# Patient Record
Sex: Female | Born: 1960 | Race: Black or African American | Hispanic: No | Marital: Single | Smoking: Never smoker
Health system: Southern US, Community
[De-identification: ages and names within clinical notes are randomized; demographics above are authoritative.]

## PROBLEM LIST (undated history)

## (undated) DIAGNOSIS — R011 Cardiac murmur, unspecified: Secondary | ICD-10-CM

## (undated) DIAGNOSIS — C801 Malignant (primary) neoplasm, unspecified: Secondary | ICD-10-CM

## (undated) DIAGNOSIS — F1910 Other psychoactive substance abuse, uncomplicated: Secondary | ICD-10-CM

## (undated) DIAGNOSIS — I38 Endocarditis, valve unspecified: Secondary | ICD-10-CM

## (undated) DIAGNOSIS — R001 Bradycardia, unspecified: Secondary | ICD-10-CM

## (undated) DIAGNOSIS — K5900 Constipation, unspecified: Secondary | ICD-10-CM

## (undated) DIAGNOSIS — G5600 Carpal tunnel syndrome, unspecified upper limb: Secondary | ICD-10-CM

## (undated) HISTORY — PX: COLON SURGERY: SHX602

## (undated) HISTORY — PX: COLONOSCOPY: SHX174

---

## 2004-03-12 HISTORY — PX: HERNIA REPAIR: SHX51

## 2004-04-15 ENCOUNTER — Emergency Department: Payer: Self-pay | Admitting: Emergency Medicine

## 2005-01-19 ENCOUNTER — Other Ambulatory Visit: Payer: Self-pay

## 2005-01-19 ENCOUNTER — Emergency Department: Payer: Self-pay | Admitting: Emergency Medicine

## 2005-06-12 ENCOUNTER — Ambulatory Visit: Payer: Self-pay | Admitting: Family Medicine

## 2005-09-20 ENCOUNTER — Emergency Department: Payer: Self-pay | Admitting: Emergency Medicine

## 2006-07-20 ENCOUNTER — Emergency Department: Payer: Self-pay | Admitting: Internal Medicine

## 2007-09-05 ENCOUNTER — Emergency Department: Payer: Self-pay | Admitting: Emergency Medicine

## 2008-10-16 ENCOUNTER — Emergency Department: Payer: Self-pay | Admitting: Unknown Physician Specialty

## 2009-03-24 ENCOUNTER — Ambulatory Visit: Payer: Self-pay | Admitting: Family Medicine

## 2009-12-11 ENCOUNTER — Emergency Department: Payer: Self-pay | Admitting: Emergency Medicine

## 2010-05-17 ENCOUNTER — Ambulatory Visit: Payer: Self-pay | Admitting: Family Medicine

## 2010-12-29 DIAGNOSIS — G5600 Carpal tunnel syndrome, unspecified upper limb: Secondary | ICD-10-CM | POA: Insufficient documentation

## 2011-02-18 ENCOUNTER — Emergency Department: Payer: Self-pay | Admitting: Emergency Medicine

## 2011-04-23 ENCOUNTER — Emergency Department: Payer: Self-pay | Admitting: Emergency Medicine

## 2011-04-23 LAB — URINALYSIS, COMPLETE
Bilirubin,UR: NEGATIVE
Glucose,UR: NEGATIVE mg/dL (ref 0–75)
Nitrite: NEGATIVE
Ph: 6 (ref 4.5–8.0)
WBC UR: 1 /HPF (ref 0–5)

## 2011-09-25 ENCOUNTER — Ambulatory Visit: Payer: Self-pay | Admitting: Family Medicine

## 2012-06-17 DIAGNOSIS — K5900 Constipation, unspecified: Secondary | ICD-10-CM | POA: Insufficient documentation

## 2012-10-08 ENCOUNTER — Ambulatory Visit: Payer: Self-pay | Admitting: Family Medicine

## 2012-10-27 ENCOUNTER — Encounter: Payer: Self-pay | Admitting: Family Medicine

## 2012-11-10 ENCOUNTER — Encounter: Payer: Self-pay | Admitting: Family Medicine

## 2014-02-17 ENCOUNTER — Emergency Department: Payer: Self-pay | Admitting: Emergency Medicine

## 2014-02-17 LAB — COMPREHENSIVE METABOLIC PANEL
ALK PHOS: 67 U/L
Albumin: 3.7 g/dL (ref 3.4–5.0)
Anion Gap: 7 (ref 7–16)
BILIRUBIN TOTAL: 0.4 mg/dL (ref 0.2–1.0)
BUN: 12 mg/dL (ref 7–18)
CHLORIDE: 106 mmol/L (ref 98–107)
CREATININE: 0.96 mg/dL (ref 0.60–1.30)
Calcium, Total: 9.2 mg/dL (ref 8.5–10.1)
Co2: 27 mmol/L (ref 21–32)
EGFR (Non-African Amer.): >60
Glucose: 88 mg/dL (ref 65–99)
Osmolality: 279 (ref 275–301)
Potassium: 3.7 mmol/L (ref 3.5–5.1)
SGOT(AST): 15 U/L (ref 15–37)
SGPT (ALT): 22 U/L
Sodium: 140 mmol/L (ref 136–145)
Total Protein: 7.6 g/dL (ref 6.4–8.2)

## 2014-02-17 LAB — CBC
HCT: 38.3 % (ref 35.0–47.0)
HGB: 12.3 g/dL (ref 12.0–16.0)
MCH: 28.5 pg (ref 26.0–34.0)
MCHC: 32.1 g/dL (ref 32.0–36.0)
MCV: 89 fL (ref 80–100)
Platelet: 267 10*3/uL (ref 150–440)
RBC: 4.32 10*6/uL (ref 3.80–5.20)
RDW: 14.7 % — AB (ref 11.5–14.5)
WBC: 4.8 10*3/uL (ref 3.6–11.0)

## 2014-02-17 LAB — URINALYSIS, COMPLETE
Bilirubin,UR: NEGATIVE
Blood: NEGATIVE
GLUCOSE, UR: NEGATIVE mg/dL (ref 0–75)
Ketone: NEGATIVE
Leukocyte Esterase: NEGATIVE
Nitrite: POSITIVE
Ph: 6 (ref 4.5–8.0)
Protein: NEGATIVE
RBC,UR: 2 /HPF (ref 0–5)
Specific Gravity: 1.021 (ref 1.003–1.030)
Squamous Epithelial: 1
WBC UR: 7 /HPF (ref 0–5)

## 2014-02-19 LAB — URINE CULTURE

## 2014-11-19 ENCOUNTER — Other Ambulatory Visit: Payer: Self-pay | Admitting: Family Medicine

## 2014-12-22 ENCOUNTER — Other Ambulatory Visit: Payer: Self-pay | Admitting: Family Medicine

## 2015-01-25 ENCOUNTER — Other Ambulatory Visit: Payer: Self-pay | Admitting: Family Medicine

## 2015-01-25 DIAGNOSIS — Z1239 Encounter for other screening for malignant neoplasm of breast: Secondary | ICD-10-CM

## 2015-01-27 ENCOUNTER — Ambulatory Visit
Admission: RE | Admit: 2015-01-27 | Discharge: 2015-01-27 | Disposition: A | Discharge status: Home / Self Care | Payer: Medicaid Other | Source: Ambulatory Visit | Attending: Family Medicine | Admitting: Family Medicine

## 2015-01-27 DIAGNOSIS — Z1231 Encounter for screening mammogram for malignant neoplasm of breast: Secondary | ICD-10-CM | POA: Diagnosis not present

## 2015-01-27 DIAGNOSIS — Z1239 Encounter for other screening for malignant neoplasm of breast: Secondary | ICD-10-CM

## 2015-04-01 ENCOUNTER — Encounter: Admission: RE | Payer: Self-pay | Source: Ambulatory Visit

## 2015-04-01 ENCOUNTER — Ambulatory Visit: Admission: RE | Admit: 2015-04-01 | Payer: Medicaid Other | Source: Ambulatory Visit | Admitting: Gastroenterology

## 2015-04-01 SURGERY — COLONOSCOPY WITH PROPOFOL
Anesthesia: General

## 2015-04-22 ENCOUNTER — Encounter: Payer: Self-pay | Admitting: *Deleted

## 2015-04-25 ENCOUNTER — Ambulatory Visit: Admission: RE | Admit: 2015-04-25 | Payer: Medicaid Other | Source: Ambulatory Visit | Admitting: Gastroenterology

## 2015-04-25 HISTORY — DX: Other psychoactive substance abuse, uncomplicated: F19.10

## 2015-04-25 HISTORY — DX: Endocarditis, valve unspecified: I38

## 2015-04-25 HISTORY — DX: Bradycardia, unspecified: R00.1

## 2015-04-25 HISTORY — DX: Cardiac murmur, unspecified: R01.1

## 2015-04-25 SURGERY — COLONOSCOPY WITH PROPOFOL
Anesthesia: General

## 2015-06-06 ENCOUNTER — Ambulatory Visit
Admission: EM | Admit: 2015-06-06 | Discharge: 2015-06-06 | Disposition: A | Discharge status: Home / Self Care | Payer: Medicaid Other | Attending: Emergency Medicine | Admitting: Emergency Medicine

## 2015-06-06 DIAGNOSIS — M5441 Lumbago with sciatica, right side: Secondary | ICD-10-CM | POA: Diagnosis not present

## 2015-06-06 MED ORDER — TRAMADOL HCL 50 MG PO TABS: ORAL_TABLET | ORAL | Status: DC

## 2015-06-06 MED ORDER — DICLOFENAC SODIUM 75 MG PO TBEC: 75.0000 mg | DELAYED_RELEASE_TABLET | Freq: Two times a day (BID) | ORAL | Status: DC

## 2015-06-06 MED ORDER — KETOROLAC TROMETHAMINE 60 MG/2ML IM SOLN
60.0000 mg | Freq: Once | INTRAMUSCULAR | Status: AC
  Administered 2015-06-06: 60 mg via INTRAMUSCULAR

## 2015-06-06 MED ORDER — METAXALONE 800 MG PO TABS: 800.0000 mg | ORAL_TABLET | Freq: Three times a day (TID) | ORAL | Status: DC

## 2015-06-06 MED ORDER — PREDNISONE 20 MG PO TABS: ORAL_TABLET | ORAL | Status: DC

## 2015-06-06 NOTE — ED Notes (Signed)
Patient states that she has a "paralyzing" back pain that has been intermittant for 1 year. Patient states that it worsened recently.

## 2015-06-06 NOTE — Discharge Instructions (Signed)
Follow-up with her primary care physician in several days for referral to physical therapy. Take the diclofenac twice a day, prednisone, the steroid and the Skelaxin. Tramadol for severe pain only. Go to the ER for the signs and symptoms we discussed.

## 2015-06-06 NOTE — ED Provider Notes (Signed)
HPI  SUBJECTIVE:  Courtney Ball is a 55 y.o. female who presents with stabbing right lower back pain with radiation down the back of her leg for the past or 4 days. 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 Symptoms are worse from going from sitting to standing and bending forward. No alleviating factors. She has been taking Tylenol 1000 mg every 4 hours yesterday. Has also tried Excedrin and BenGay. denies N/V, fevers, flank pain, abdominal pain, urinary urgency, frequency, dysuria, cloudy or odorous urine, hematuria.   No saddle anesthesia, distal weakness/numbness, bilateral radicular leg pain/weakness, fevers/night sweats, recent h/o trauma, neurological deficits,  bladder/ bowel incontinence, urinary retention, h/o CA / multiple myleoma, unexplained weight loss, pain worse at night,  h/o prolonged steroid use, h/o osteopenia, h/o IVDU, h/o HIV, recent bacteremia, known AAA.  States feels similar to previous episodes of back pain that she has had on and off for the past year. no h/o pyelonephritis, nephrolithiasis.  PMD: Dr. Gillis Ends at [facility] primary care. LMP: Postmenopausal. Of note patient has a history of cocaine abuse, states her last use was 3 days ago.    Past Medical History  Diagnosis Date  . Bradycardia   . Drug abuse   . Heart murmur   . Valvular heart disease     Past Surgical History  Procedure Laterality Date  . Hernia repair  2006    ventral    History reviewed. No pertinent family history.  Social History  Substance Use Topics  . Smoking status: Never Smoker   . Smokeless tobacco: Never Used  . Alcohol Use: No    No current facility-administered medications for this encounter.  Current outpatient prescriptions:  .  diclofenac (VOLTAREN) 75 MG EC tablet, Take 1 tablet (75 mg total) by mouth 2 (two) times daily. Take with food, Disp: 30 tablet, Rfl: 0 .  metaxalone (SKELAXIN) 800  MG tablet, Take 1 tablet (800 mg total) by mouth 3 (three) times daily., Disp: 21 tablet, Rfl: 0 .  predniSONE (DELTASONE) 20 MG tablet, Take 3 tabs po on first day, 2 tabs second day, 2 tabs third day, 1 tab fourth day, 1 tab 5th day. Take with food., Disp: 9 tablet, Rfl: 0 .  traMADol (ULTRAM) 50 MG tablet, 1-2 tabs po q 6 hr prn pain Maximum dose= 8 tablets per day, Disp: 20 tablet, Rfl: 0  Allergies  Allergen Reactions  . Sulfa Antibiotics Hives     ROS  As noted in HPI.   Physical Exam  BP 148/71 mmHg  Pulse 73  Temp(Src) 97.8 F (36.6 C) (Tympanic)  Resp 16  Ht  (1.626 m)  Wt 132 lb (59.875 kg)  BMI 22.65 kg/m2  SpO2 100%  Constitutional: Well developed, well nourished, appears uncomfortable Eyes:  EOMI, conjunctiva normal bilaterally HENT: Normocephalic, atraumatic,mucus membranes moist Respiratory: Normal inspiratory effort Cardiovascular: Normal rate GI: nondistended. No suprapubic tenderness. No pulsatile abdominal mass. skin: No rash, skin intact Musculoskeletal: no CVAT. +  paralumbar tenderness, - muscle spasm. No bony tenderness. Bilateral lower extremities nontender , baseline ROM with intact PT pulses. No pain with int/ext rotation extension hips bilaterally.  back pain aggravated with active right hip flexion against resistance. Positive tenderness at the sciatic notch. SLR positive right side.  baseline light touch intact  bilaterally for Pt, DTR's symmetric and intact bilaterally KJ, Motor symmetric bilateral 5/5 hip flexion, quadriceps, hamstrings, EHL,  foot dorsiflexion, foot plantarflexion, gait somewhat antalgic but without apparent new ataxia. Neurologic: Alert & oriented x 3, no focal neuro deficits Psychiatric: Speech and behavior appropriate   ED Course   Medications  ketorolac (TORADOL) injection 60 mg (60 mg Intramuscular Given 06/06/15 2005)    No orders of the defined types were placed in this encounter.    No results found for this  or any previous visit (from the past 24 hour(s)). No results found.  ED Clinical Impression  Right-sided low back pain with right-sided sciatica   ED Assessment/Plan  [place] narcotic database reviewed. Pt with no narcotic rx in the past 6 months.  No Historical evidence of uti, nephrolithiasis.  No evidence of spinal cord involvement based on H&P. Pt describing typical back pain, has been < 6 week duration. No historical red flags as noted in HPI. No physical red flags such as fever, bony tenderness, lower extremity weakness, saddle anesthesia. Imaging not indicated at this time.   Giving shot of Toradol. Pt ambulatory in the UC. Home with NSAID, tramadol, muscle relaxants, steriod. Pt to f/u with PMD for referral to physical therapy. Wrote for 2 day work note.  Discussed medical decision-making, and plan for follow-up with the patient.  Discussed signs and symptoms that should prompt return to the emergency department.  Patient agrees with plan.  *This clinic note was created using Dragon dictation software. Therefore, there may be occasional mistakes despite careful proofreading.  ?     Domenick GongAshley James Lafalce, MD 06/06/15 2202

## 2016-04-12 ENCOUNTER — Other Ambulatory Visit: Payer: Self-pay | Admitting: Family Medicine

## 2016-04-12 DIAGNOSIS — Z1239 Encounter for other screening for malignant neoplasm of breast: Secondary | ICD-10-CM

## 2016-06-26 ENCOUNTER — Ambulatory Visit: Admission: RE | Admit: 2016-06-26 | Payer: Medicaid Other | Source: Ambulatory Visit

## 2016-07-02 ENCOUNTER — Ambulatory Visit: Payer: Medicaid Other

## 2016-07-03 ENCOUNTER — Ambulatory Visit
Admission: RE | Admit: 2016-07-03 | Discharge: 2016-07-03 | Disposition: A | Discharge status: Home / Self Care | Payer: Medicaid Other | Source: Ambulatory Visit | Attending: Family Medicine | Admitting: Family Medicine

## 2016-07-03 DIAGNOSIS — Z1239 Encounter for other screening for malignant neoplasm of breast: Secondary | ICD-10-CM

## 2016-07-03 DIAGNOSIS — Z1231 Encounter for screening mammogram for malignant neoplasm of breast: Secondary | ICD-10-CM | POA: Insufficient documentation

## 2016-09-19 ENCOUNTER — Encounter: Payer: Self-pay | Admitting: Certified Registered"

## 2016-09-19 ENCOUNTER — Encounter: Payer: Self-pay | Admitting: *Deleted

## 2016-09-19 ENCOUNTER — Ambulatory Visit
Admission: RE | Admit: 2016-09-19 | Discharge: 2016-09-19 | Disposition: A | Discharge status: Home / Self Care | Payer: Medicaid Other | Source: Ambulatory Visit | Attending: Unknown Physician Specialty | Admitting: Unknown Physician Specialty

## 2016-09-19 ENCOUNTER — Encounter
Admission: RE | Disposition: A | Discharge status: Home / Self Care | Payer: Self-pay | Source: Ambulatory Visit | Attending: Unknown Physician Specialty

## 2016-09-19 DIAGNOSIS — Z5309 Procedure and treatment not carried out because of other contraindication: Secondary | ICD-10-CM | POA: Diagnosis not present

## 2016-09-19 DIAGNOSIS — Z01818 Encounter for other preprocedural examination: Secondary | ICD-10-CM | POA: Insufficient documentation

## 2016-09-19 HISTORY — DX: Constipation, unspecified: K59.00

## 2016-09-19 HISTORY — DX: Carpal tunnel syndrome, unspecified upper limb: G56.00

## 2016-09-19 LAB — URINE DRUG SCREEN, QUALITATIVE (ARMC ONLY)
AMPHETAMINES, UR SCREEN: NOT DETECTED
BENZODIAZEPINE, UR SCRN: NOT DETECTED
Barbiturates, Ur Screen: NOT DETECTED
COCAINE METABOLITE, UR ~~LOC~~: POSITIVE — AB
Cannabinoid 50 Ng, Ur ~~LOC~~: POSITIVE — AB
MDMA (Ecstasy)Ur Screen: NOT DETECTED
METHADONE SCREEN, URINE: NOT DETECTED
OPIATE, UR SCREEN: NOT DETECTED
Phencyclidine (PCP) Ur S: NOT DETECTED
Tricyclic, Ur Screen: POSITIVE — AB

## 2016-09-19 LAB — POCT PREGNANCY, URINE: Preg Test, Ur: NEGATIVE

## 2016-09-19 SURGERY — COLONOSCOPY WITH PROPOFOL
Anesthesia: General

## 2016-09-19 MED ORDER — SODIUM CHLORIDE 0.9 % IV SOLN: INTRAVENOUS | Status: DC

## 2016-09-19 NOTE — Progress Notes (Signed)
Urine drug screen positive for cocaine and cannibis. Dr. Henrene HawkingKephart and Dr. Mechele CollinElliott aware. Procedure cancelled. Explained reason for cancellation to patient and encouraged her to stop using these drugs and she verbalized understanding.  Explained for her to reschedule the colonoscopy if she would like to and not to use the cocaine and marijuana for at least 2 months prior to the testing. She says she will reschedule today with Dr. Earnest ConroyElliott's office.

## 2017-03-18 ENCOUNTER — Ambulatory Visit: Payer: Self-pay | Admitting: Gastroenterology

## 2017-10-11 ENCOUNTER — Other Ambulatory Visit: Payer: Self-pay

## 2017-10-11 ENCOUNTER — Emergency Department
Admission: EM | Admit: 2017-10-11 | Discharge: 2017-10-11 | Disposition: A | Discharge status: Home / Self Care | Payer: Self-pay | Attending: Student in an Organized Health Care Education/Training Program | Admitting: Student in an Organized Health Care Education/Training Program

## 2017-10-11 ENCOUNTER — Encounter: Payer: Self-pay | Admitting: Emergency Medicine

## 2017-10-11 ENCOUNTER — Emergency Department: Payer: Self-pay

## 2017-10-11 DIAGNOSIS — X58XXXA Exposure to other specified factors, initial encounter: Secondary | ICD-10-CM | POA: Insufficient documentation

## 2017-10-11 DIAGNOSIS — Y999 Unspecified external cause status: Secondary | ICD-10-CM | POA: Insufficient documentation

## 2017-10-11 DIAGNOSIS — Y929 Unspecified place or not applicable: Secondary | ICD-10-CM | POA: Insufficient documentation

## 2017-10-11 DIAGNOSIS — S76111A Strain of right quadriceps muscle, fascia and tendon, initial encounter: Secondary | ICD-10-CM

## 2017-10-11 DIAGNOSIS — Y939 Activity, unspecified: Secondary | ICD-10-CM | POA: Insufficient documentation

## 2017-10-11 MED ORDER — METAXALONE 800 MG PO TABS: 800.0000 mg | ORAL_TABLET | Freq: Three times a day (TID) | ORAL | 1 refills | Status: AC

## 2017-10-11 MED ORDER — KETOROLAC TROMETHAMINE 30 MG/ML IJ SOLN
30.0000 mg | Freq: Once | INTRAMUSCULAR | Status: AC
  Administered 2017-10-11: 30 mg via INTRAMUSCULAR
  Filled 2017-10-11: qty 1

## 2017-10-11 MED ORDER — MELOXICAM 15 MG PO TABS: 15.0000 mg | ORAL_TABLET | Freq: Every day | ORAL | 0 refills | Status: AC

## 2017-10-11 MED ORDER — KETOROLAC TROMETHAMINE 10 MG PO TABS: 10.0000 mg | ORAL_TABLET | Freq: Three times a day (TID) | ORAL | 0 refills | Status: DC

## 2017-10-11 NOTE — ED Notes (Signed)
AAO3.  Skin warm and dry. nad

## 2017-10-11 NOTE — ED Triage Notes (Signed)
C/O intermittent pain to right upper leg "for a while".  Patient describes episodes as painful and when they occur right leg "gives out'.   Patient is AAOx3.  Skin warm and dry. NAD.  Ambulatory independently.

## 2017-10-11 NOTE — Discharge Instructions (Addendum)
Your exam and x-ray are consistent with a thigh muscle strain, specifically, the Sartorious muscle. This muscle is responsible for pulling your knee up and rotating the thigh outward. You x-ray is negative for any acute changes from previous studies. You should take the prescription meds as directed. Follow-up with your provider for ongoing symptoms.

## 2017-10-12 NOTE — ED Provider Notes (Signed)
[facility] Emergency Department Provider Note ____________________________________________  Time seen: 1533  I have reviewed the triage vital signs and the nursing notes.  HISTORY  Chief Complaint  Leg Pain  HPI Courtney Ball is a 57 y.o. female inserts up to the ED for evaluation of a 51-month complaint of right thigh pain.  Patient describes pain localized to the right sartorius muscle when prompted.  She describes pain is worse with attempts to flex the leg at the hip.  She denies any preceding injury, accident, trauma, fall.  She also denies any outright low back pain or distal paresthesias.  She is without foot drop, saddle anesthesias, or incontinence.  She has had symptoms to the posterior left leg in the past and had Flexeril and meloxicam with limited benefit.  She is been taken Tylenol for this more acute flare of her right thigh pain over the last 2 to 3 days.  She reports pain is severe enough that she has to use her hands to lift upper leg at the hip to get into the car and to rollover in bed.  Past Medical History:  Diagnosis Date  . Bradycardia   . Carpal tunnel syndrome   . Constipation   . Drug abuse (HCC)   . Heart murmur   . Valvular heart disease     There are no active problems to display for this patient.   Past Surgical History:  Procedure Laterality Date  . COLONOSCOPY    . HERNIA REPAIR  2006   ventral    Prior to Admission medications   Medication Sig Start Date End Date Taking? Authorizing Provider  ketorolac (TORADOL) 10 MG tablet Take 1 tablet (10 mg total) by mouth every 8 (eight) hours. 10/11/17   Krystalynn Ridgeway, Charlesetta Ivory, PA-C  meloxicam (MOBIC) 15 MG tablet Take 1 tablet (15 mg total) by mouth daily. 10/11/17 11/10/17  Mallorey Odonell, Charlesetta Ivory, PA-C  metaxalone (SKELAXIN) 800 MG tablet Take 1 tablet (800 mg total) by mouth 3 (three) times daily. 10/11/17 11/10/17  Zaydon Kinser, Charlesetta Ivory, PA-C   Allergies Sulfa  antibiotics  Family History  Problem Relation Age of Onset  . Breast cancer Neg Hx     Social History Social History   Tobacco Use  . Smoking status: Never Smoker  . Smokeless tobacco: Never Used  Substance Use Topics  . Alcohol use: No    Alcohol/week: 0.0 oz  . Drug use: Yes    Types: Cocaine    Comment: cocaine    Review of Systems  Constitutional: Negative for fever. Cardiovascular: Negative for chest pain. Respiratory: Negative for shortness of breath. Gastrointestinal: Negative for abdominal pain, vomiting and diarrhea. Genitourinary: Negative for dysuria. Musculoskeletal: Negative for back pain. Reports right thigh pain as above Skin: Negative for rash. Neurological: Negative for headaches, focal weakness or numbness. ____________________________________________  PHYSICAL EXAM:  VITAL SIGNS: ED Triage Vitals  Enc Vitals Group     BP 10/11/17 1523 108/74     Pulse Rate 10/11/17 1523 69     Resp 10/11/17 1523 16     Temp 10/11/17 1523 98.6 F (37 C)     Temp Source 10/11/17 1523 Oral     SpO2 10/11/17 1523 97 %     Weight 10/11/17 1519 136 lb (61.7 kg)     Height 10/11/17 1519 5\' 4"  (1.626 m)     Head Circumference --      Peak Flow --  Pain Score 10/11/17 1519 8     Pain Loc --      Pain Edu? --      Excl. in GC? --     Constitutional: Alert and oriented. Well appearing and in no distress. Head: Normocephalic and atraumatic. Cardiovascular: Normal rate, regular rhythm. Normal distal pulses. Respiratory: Normal respiratory effort. No wheezes/rales/rhonchi. Gastrointestinal: Soft and nontender. No distention. Musculoskeletal: Normal spinal alignment without midline tenderness, spasm, deformity, or step-off.  Patient with tenderness to palpation over the right sartorius muscle region.  She is also noted to have some pain with attempts to flex the hip with a bent knee.  Normal internal and external rotation of the right hip.  Nontender with normal  range of motion in all extremities.  Neurologic: Nerves II through XII grossly intact.  Normal LE DTRs bilaterally.  Negative supine straight leg raise.  Normal gait without ataxia. Normal speech and language. No gross focal neurologic deficits are appreciated. Skin:  Skin is warm, dry and intact. No rash noted. ___________________________________________   RADIOLOGY  Lumbar Spine  IMPRESSION: Mild lumbar spondylosis. No acute osseous abnormality identified. ____________________________________________  PROCEDURES  Procedures Toradol 30 mg IM ____________________________________________  INITIAL IMPRESSION / ASSESSMENT AND PLAN / ED COURSE  Patient with ED evaluation of an acute flare of a chronic right thigh pain and weakness.  Patient symptoms are consistent with quad muscle strain specifically is a Sartorious muscle strain.  Patient is reassured by her lumbar x-rays which do not reveal any acute degenerative disc disease at L3-4.  She does have some mild arthritis of the lumbar spine.  Patient reports resolution of her symptoms prior to discharge after demonstration of IM Toradol.  She is discharged with a prescription for the same as well as Skelaxin and will dose meloxicam at the conclusion of Toradol regimen.  Patient will follow with primary provider for ongoing symptom management.  Return precautions have been reviewed. ____________________________________________  FINAL CLINICAL IMPRESSION(S) / ED DIAGNOSES  Final diagnoses:  Quadriceps muscle strain, right, initial encounter      Lissa HoardMenshew, Sabriah Hobbins V Bacon, PA-C 10/12/17 Warren [id number]    Robinson, Patrick, MD 10/13/17 1102

## 2018-01-05 ENCOUNTER — Encounter: Payer: Self-pay | Admitting: Emergency Medicine

## 2018-01-05 DIAGNOSIS — K921 Melena: Secondary | ICD-10-CM | POA: Insufficient documentation

## 2018-01-05 DIAGNOSIS — Z8679 Personal history of other diseases of the circulatory system: Secondary | ICD-10-CM | POA: Insufficient documentation

## 2018-01-05 DIAGNOSIS — K5900 Constipation, unspecified: Secondary | ICD-10-CM | POA: Insufficient documentation

## 2018-01-05 LAB — CBC
HCT: 38.9 % (ref 36.0–46.0)
Hemoglobin: 12.5 g/dL (ref 12.0–15.0)
MCH: 28.5 pg (ref 26.0–34.0)
MCHC: 32.1 g/dL (ref 30.0–36.0)
MCV: 88.6 fL (ref 80.0–100.0)
NRBC: 0 % (ref 0.0–0.2)
PLATELETS: 278 10*3/uL (ref 150–400)
RBC: 4.39 MIL/uL (ref 3.87–5.11)
RDW: 13.9 % (ref 11.5–15.5)
WBC: 4.9 10*3/uL (ref 4.0–10.5)

## 2018-01-05 LAB — URINALYSIS, COMPLETE (UACMP) WITH MICROSCOPIC
BILIRUBIN URINE: NEGATIVE
Bacteria, UA: NONE SEEN
GLUCOSE, UA: NEGATIVE mg/dL
HGB URINE DIPSTICK: NEGATIVE
KETONES UR: NEGATIVE mg/dL
LEUKOCYTES UA: NEGATIVE
NITRITE: NEGATIVE
PH: 8 (ref 5.0–8.0)
Protein, ur: NEGATIVE mg/dL
SPECIFIC GRAVITY, URINE: 1.02 (ref 1.005–1.030)
WBC, UA: NONE SEEN WBC/hpf (ref 0–5)

## 2018-01-05 LAB — COMPREHENSIVE METABOLIC PANEL WITH GFR
ALT: 19 U/L (ref 0–44)
AST: 23 U/L (ref 15–41)
Albumin: 3.4 g/dL — ABNORMAL LOW (ref 3.5–5.0)
Alkaline Phosphatase: 40 U/L (ref 38–126)
Anion gap: 6 (ref 5–15)
BUN: 15 mg/dL (ref 6–20)
CO2: 28 mmol/L (ref 22–32)
Calcium: 9.3 mg/dL (ref 8.9–10.3)
Chloride: 110 mmol/L (ref 98–111)
Creatinine, Ser: 0.82 mg/dL (ref 0.44–1.00)
GFR calc Af Amer: >60 mL/min
GFR calc non Af Amer: >60 mL/min
Glucose, Bld: 100 mg/dL — ABNORMAL HIGH (ref 70–99)
Potassium: 4 mmol/L (ref 3.5–5.1)
Sodium: 144 mmol/L (ref 135–145)
Total Bilirubin: 0.3 mg/dL (ref 0.3–1.2)
Total Protein: 5.9 g/dL — ABNORMAL LOW (ref 6.5–8.1)

## 2018-01-05 LAB — LIPASE, BLOOD: Lipase: 32 U/L (ref 11–51)

## 2018-01-05 NOTE — ED Triage Notes (Signed)
Patient with complaint of generalized abdominal pain. Patient states that she had BM on Friday and Saturday with bright red blood. Patient denies nausea or vomiting. Patient also with complaint of mid back pain that started last night after walker her dog and her pulled the leash.

## 2018-01-06 ENCOUNTER — Emergency Department
Admission: EM | Admit: 2018-01-06 | Discharge: 2018-01-06 | Disposition: A | Discharge status: Home / Self Care | Payer: Medicaid Other | Attending: Emergency Medicine | Admitting: Emergency Medicine

## 2018-01-06 DIAGNOSIS — K5900 Constipation, unspecified: Secondary | ICD-10-CM

## 2018-01-06 DIAGNOSIS — K921 Melena: Secondary | ICD-10-CM

## 2018-01-06 NOTE — ED Provider Notes (Signed)
[facility] Emergency Department Provider Note  ____________________________________________   First MD Initiated Contact with Patient 01/06/18 0124     (approximate)  I have reviewed the triage vital signs and the nursing notes.   HISTORY  Chief Complaint Rectal Bleeding   HPI Courtney Ball is a 57 y.o. female who comes to the emergency department with generalized abdominal pain for the past several months.  It became acutely worse tonight which prompted the visit.  Symptoms began several hours prior to arrival and is been constant.  They are moderate severity diffusely across her abdomen.  No nausea or vomiting.  She does report constipation.  She normally has 1 bowel movement every week and has never had a colonoscopy.  She also reports having some bright red blood painlessly per rectum.  It does not hurt when she defecates.  She feels no masses.   She denies fevers or chills.    Past Medical History:  Diagnosis Date  . Bradycardia   . Carpal tunnel syndrome   . Constipation   . Drug abuse (HCC)   . Heart murmur   . Valvular heart disease     There are no active problems to display for this patient.   Past Surgical History:  Procedure Laterality Date  . COLONOSCOPY    . HERNIA REPAIR  2006   ventral    Prior to Admission medications   Medication Sig Start Date End Date Taking? Authorizing Provider  ketorolac (TORADOL) 10 MG tablet Take 1 tablet (10 mg total) by mouth every 8 (eight) hours. 10/11/17   Menshew, Charlesetta Ivory, PA-C    Allergies Sulfa antibiotics  Family History  Problem Relation Age of Onset  . Breast cancer Neg Hx     Social History Social History   Tobacco Use  . Smoking status: Never Smoker  . Smokeless tobacco: Never Used  Substance Use Topics  . Alcohol use: No    Alcohol/week: 0.0 standard drinks  . Drug use: Yes    Types: Cocaine    Comment: cocaine    Review of Systems Constitutional: No  fever/chills Eyes: No visual changes. ENT: No sore throat. Cardiovascular: Denies chest pain. Respiratory: Denies shortness of breath. Gastrointestinal: Positive for abdominal pain.  No nausea, no vomiting.  No diarrhea.  Positive for constipation. Genitourinary: Negative for dysuria. Musculoskeletal: Negative for back pain. Skin: Negative for rash. Neurological: Negative for headaches, focal weakness or numbness.   ____________________________________________   PHYSICAL EXAM:  VITAL SIGNS: ED Triage Vitals  Enc Vitals Group     BP 01/05/18 1950 130/73     Pulse Rate 01/05/18 1950 73     Resp 01/05/18 1950 18     Temp 01/05/18 1950 98.2 F (36.8 C)     Temp Source 01/05/18 1950 Oral     SpO2 01/05/18 1950 99 %     Weight 01/05/18 1951 135 lb (61.2 kg)     Height 01/05/18 1951 5\' 4"  (1.626 m)     Head Circumference --      Peak Flow --      Pain Score 01/05/18 1951 9     Pain Loc --      Pain Edu? --      Excl. in GC? --     Constitutional: Alert and oriented x4 nontoxic no diaphoresis speaks in full clear sentences Eyes: PERRL EOMI. Head: Atraumatic. Nose: No congestion/rhinnorhea. Mouth/Throat: No trismus Neck: No stridor.   Cardiovascular: Normal rate, regular  rhythm. Grossly normal heart sounds.  Good peripheral circulation. Respiratory: Normal respiratory effort.  No retractions. Lungs CTAB and moving good air Gastrointestinal: Soft nondistended nontender no rebound or guarding no peritonitis Musculoskeletal: No lower extremity edema   Neurologic:  Normal speech and language. No gross focal neurologic deficits are appreciated. Skin:  Skin is warm, dry and intact. No rash noted. Psychiatric: Mood and affect are normal. Speech and behavior are normal.    ____________________________________________   DIFFERENTIAL includes but not limited to  Internal hemorrhoid, anal fissure, external hemorrhoid, colon cancer,  diverticulosis ____________________________________________   LABS (all labs ordered are listed, but only abnormal results are displayed)  Labs Reviewed  COMPREHENSIVE METABOLIC PANEL - Abnormal; Notable for the following components:      Result Value   Glucose, Bld 100 (*)    Total Protein 5.9 (*)    Albumin 3.4 (*)    All other components within normal limits  URINALYSIS, COMPLETE (UACMP) WITH MICROSCOPIC - Abnormal; Notable for the following components:   Color, Urine YELLOW (*)    APPearance CLOUDY (*)    All other components within normal limits  LIPASE, BLOOD  CBC    Lab work reviewed by me with no acute disease __________________________________________  EKG   ____________________________________________  RADIOLOGY   ____________________________________________   PROCEDURES  Procedure(s) performed: no  Procedures  Critical Care performed: no  ____________________________________________   INITIAL IMPRESSION / ASSESSMENT AND PLAN / ED COURSE  Pertinent labs & imaging results that were available during my care of the patient were reviewed by me and considered in my medical decision making (see chart for details).   As part of my medical decision making, I reviewed the following data within the electronic MEDICAL RECORD NUMBER History obtained from family if available, nursing notes, old chart and ekg, as well as notes from prior ED visits.  The patient comes to the emergency department with chronic constipation along with hematochezia.  Some mild abdominal discomfort.  Lab work is reassuring.  I discussed with the patient that she likely has internal hemorrhoids however given her age and hematochezia I do believe she requires evaluation by gastroenterology.  We had a lengthy discussion regarding chronic constipation and the importance of increasing the fiber in her diet along with the numerous over-the-counter medications including Colace and Dulcolax as well as  mag citrate that she can try to help alleviate her symptoms.  Strict return precautions have been given.      ____________________________________________   FINAL CLINICAL IMPRESSION(S) / ED DIAGNOSES  Final diagnoses:  Constipation, unspecified constipation type  Hematochezia      NEW MEDICATIONS STARTED DURING THIS VISIT:  Discharge Medication List as of 01/06/2018  2:05 AM       Note:  This document was prepared using Dragon voice recognition software and may include unintentional dictation errors.     Merrily Brittle, MD 01/09/18 2233

## 2018-01-06 NOTE — ED Notes (Signed)
States bright red bleeding with some small clots from rectum today.  Mid abd pain for the past several days.

## 2018-01-06 NOTE — Discharge Instructions (Signed)
Fortunately today your lab work was reassuring.  Please increase the amount of water and fiber you take in and follow-up with the gastroenterologist within a week for recheck.  The two most common reasons people get constipated are dehydration and low fiber diets.  Please make sure you drink enough water so that your urine is a light yellow color and purchase over the counter metamucil (or other fiber supplement) and take at least two scoops a day.  Most laxatives are over the counter.  Begin with colace 100mg  by mouth twice a day Add on dulcolax 10mg  by mouth 1-2 times a day as needed (this may cause some cramping) Drink a bottle of magnesium citrate once daily only for severe symptoms  It was a pleasure to take care of you today, and thank you for coming to our emergency department.  If you have any questions or concerns before leaving please ask the nurse to grab me and I'm more than happy to go through your aftercare instructions again.  If you were prescribed any opioid pain medication today such as Norco, Vicodin, Percocet, morphine, hydrocodone, or oxycodone please make sure you do not drive when you are taking this medication as it can alter your ability to drive safely.  If you have any concerns once you are home that you are not improving or are in fact getting worse before you can make it to your follow-up appointment, please do not hesitate to call 911 and come back for further evaluation.  Merrily Brittle, MD  Results for orders placed or performed during the hospital encounter of 01/06/18  Lipase, blood  Result Value Ref Range   Lipase 32 11 - 51 U/L  Comprehensive metabolic panel  Result Value Ref Range   Sodium 144 135 - 145 mmol/L   Potassium 4.0 3.5 - 5.1 mmol/L   Chloride 110 98 - 111 mmol/L   CO2 28 22 - 32 mmol/L   Glucose, Bld 100 (H) 70 - 99 mg/dL   BUN 15 6 - 20 mg/dL   Creatinine, Ser 6.96 0.44 - 1.00 mg/dL   Calcium 9.3 8.9 - 29.5 mg/dL   Total Protein 5.9 (L)  6.5 - 8.1 g/dL   Albumin 3.4 (L) 3.5 - 5.0 g/dL   AST 23 15 - 41 U/L   ALT 19 0 - 44 U/L   Alkaline Phosphatase 40 38 - 126 U/L   Total Bilirubin 0.3 0.3 - 1.2 mg/dL   GFR calc non Af Amer >60 >60 mL/min   GFR calc Af Amer >60 >60 mL/min   Anion gap 6 5 - 15  CBC  Result Value Ref Range   WBC 4.9 4.0 - 10.5 K/uL   RBC 4.39 3.87 - 5.11 MIL/uL   Hemoglobin 12.5 12.0 - 15.0 g/dL   HCT 28.4 13.2 - 44.0 %   MCV 88.6 80.0 - 100.0 fL   MCH 28.5 26.0 - 34.0 pg   MCHC 32.1 30.0 - 36.0 g/dL   RDW 10.2 72.5 - 36.6 %   Platelets 278 150 - 400 K/uL   nRBC 0.0 0.0 - 0.2 %  Urinalysis, Complete w Microscopic  Result Value Ref Range   Color, Urine YELLOW (A) YELLOW   APPearance CLOUDY (A) CLEAR   Specific Gravity, Urine 1.020 1.005 - 1.030   pH 8.0 5.0 - 8.0   Glucose, UA NEGATIVE NEGATIVE mg/dL   Hgb urine dipstick NEGATIVE NEGATIVE   Bilirubin Urine NEGATIVE NEGATIVE   Ketones, ur NEGATIVE NEGATIVE  mg/dL   Protein, ur NEGATIVE NEGATIVE mg/dL   Nitrite NEGATIVE NEGATIVE   Leukocytes, UA NEGATIVE NEGATIVE   WBC, UA NONE SEEN 0 - 5 WBC/hpf   Bacteria, UA NONE SEEN NONE SEEN   Squamous Epithelial / LPF 0-5 0 - 5   Mucus PRESENT    Amorphous Crystal PRESENT

## 2018-02-18 ENCOUNTER — Ambulatory Visit (INDEPENDENT_AMBULATORY_CARE_PROVIDER_SITE_OTHER): Payer: Self-pay | Admitting: Gastroenterology

## 2018-02-18 ENCOUNTER — Encounter: Payer: Self-pay | Admitting: Gastroenterology

## 2018-02-18 ENCOUNTER — Encounter

## 2018-02-18 ENCOUNTER — Other Ambulatory Visit: Payer: Self-pay

## 2018-02-18 VITALS — BP 121/80 | HR 65 | Resp 16 | Wt 133.8 lb

## 2018-02-18 DIAGNOSIS — K625 Hemorrhage of anus and rectum: Secondary | ICD-10-CM

## 2018-02-18 DIAGNOSIS — F1910 Other psychoactive substance abuse, uncomplicated: Secondary | ICD-10-CM | POA: Insufficient documentation

## 2018-02-18 DIAGNOSIS — K5909 Other constipation: Secondary | ICD-10-CM

## 2018-02-18 NOTE — Progress Notes (Signed)
Courtney Repress, MD [address]  [address]  [place], [place] [address]  Main: [phone]  Fax: [phone]    Gastroenterology Consultation  Referring Provider:     Leim Fabry, MD Primary Care Physician:  Courtney Fabry, MD Primary Gastroenterologist:  Dr. Arlyss Ball Reason for Consultation:     Chronic constipation        HPI:   Courtney Ball is a 57 y.o. African-American female referred by Dr. Leim Fabry, MD  for consultation & management of chronic constipation.  Patient reports that she has been having frequent bowel movements for more than 10 years, usually goes about once every 1 to 2 weeks, associated with straining and hard stools.  She does report intermittent rectal bleeding generally on wiping only.  She denies weight loss, loss of appetite.  She does report abdominal pain associated with constipation.  She has tried Metamucil and MiraLAX which modestly helped.  She does not have anemia.  Never had a colonoscopy in the past  NSAIDs: None  Antiplts/Anticoagulants/Anti thrombotics: None  GI Procedures: None She denies family history of GI malignancy  Past Medical History:  Diagnosis Date  . Bradycardia   . Carpal tunnel syndrome   . Constipation   . Drug abuse (HCC)   . Heart murmur   . Valvular heart disease     Past Surgical History:  Procedure Laterality Date  . COLONOSCOPY    . HERNIA REPAIR  2006   ventral    Current Outpatient Medications:  .  cyclobenzaprine (FLEXERIL) 10 MG tablet, Take by mouth., Disp: , Rfl:  .  ketorolac (TORADOL) 10 MG tablet, Take 1 tablet (10 mg total) by mouth every 8 (eight) hours. (Patient not taking: Reported on 02/18/2018), Disp: 15 tablet, Rfl: 0 .  polyethylene glycol powder (GLYCOLAX/MIRALAX) powder, Take 1-2 capfuls (17-34g) daily.  Mix in 4-8ounces of fluid prior to taking., Disp: , Rfl:     Family History  Problem Relation Age of Onset  . Breast cancer Neg Hx      Social  History   Tobacco Use  . Smoking status: Never Smoker  . Smokeless tobacco: Never Used  Substance Use Topics  . Alcohol use: No    Alcohol/week: 0.0 standard drinks  . Drug use: Yes    Types: Cocaine    Comment: cocaine    Allergies as of 02/18/2018 - Review Complete 02/18/2018  Allergen Reaction Noted  . Sulfa antibiotics Hives 04/22/2015    Review of Systems:    All systems reviewed and negative except where noted in HPI.   Physical Exam:  BP 121/80 (BP Location: Left Arm, Patient Position: Sitting, Cuff Size: Normal)   Pulse 65   Resp 16   Wt 133 lb 12.8 oz (60.7 kg)   LMP 09/22/2014 (Approximate)   BMI 22.97 kg/m  Patient's last menstrual period was 09/22/2014 (approximate).  General:   Alert,  Well-developed, well-nourished, pleasant and cooperative in NAD Head:  Normocephalic and atraumatic. Eyes:  Sclera clear, no icterus.   Conjunctiva pink. Ears:  Normal auditory acuity. Nose:  No deformity, discharge, or lesions. Mouth:  No deformity or lesions,oropharynx pink & moist. Neck:  Supple; no masses or thyromegaly. Lungs:  Respirations even and unlabored.  Clear throughout to auscultation.   No wheezes, crackles, or rhonchi. No acute distress. Heart:  Regular rate and rhythm; no murmurs, clicks, rubs, or gallops. Abdomen:  Normal bowel sounds. Soft, non-tender and non-distended without masses, hepatosplenomegaly or hernias noted.  No guarding or rebound tenderness.   Rectal: Not performed Msk:  Symmetrical without gross deformities. Good, equal movement & strength bilaterally. Pulses:  Normal pulses noted. Extremities:  No clubbing or edema.  No cyanosis. Neurologic:  Alert and oriented x3;  grossly normal neurologically. Skin:  Intact without significant lesions or rashes. No jaundice. Psych:  Alert and cooperative. Normal mood and affect.  Imaging Studies: None  Assessment and Plan:   Courtney Ball is a 57 y.o. African-American female with chronic  constipation and intermittent rectal bleeding  Chronic constipation Discussed with her about high-fiber diet, handout provided Start fiber supplements twice daily Trial of linaclotide 290 MCG daily, samples provided  Rectal bleeding Recommend colonoscopy as she is also overdue for colon cancer screening   Follow up in 1 month   Courtney Repressohini R Courtney Koenen, MD

## 2018-02-18 NOTE — Patient Instructions (Signed)
High-Fiber Diet  Fiber, also called dietary fiber, is a type of carbohydrate found in fruits, vegetables, whole grains, and beans. A high-fiber diet can have many health benefits. Your health care provider may recommend a high-fiber diet to help:  · Prevent constipation. Fiber can make your bowel movements more regular.  · Lower your cholesterol.  · Relieve hemorrhoids, uncomplicated diverticulosis, or irritable bowel syndrome.  · Prevent overeating as part of a weight-loss plan.  · Prevent heart disease, type 2 diabetes, and certain cancers.    What is my plan?  The recommended daily intake of fiber includes:  · 38 grams for men under age 50.  · 30 grams for men over age 50.  · 25 grams for women under age 50.  · 21 grams for women over age 50.    You can get the recommended daily intake of dietary fiber by eating a variety of fruits, vegetables, grains, and beans. Your health care provider may also recommend a fiber supplement if it is not possible to get enough fiber through your diet.  What do I need to know about a high-fiber diet?  · Fiber supplements have not been widely studied for their effectiveness, so it is better to get fiber through food sources.  · Always check the fiber content on the nutrition facts label of any prepackaged food. Look for foods that contain at least 5 grams of fiber per serving.  · Ask your dietitian if you have questions about specific foods that are related to your condition, especially if those foods are not listed in the following section.  · Increase your daily fiber consumption gradually. Increasing your intake of dietary fiber too quickly may cause bloating, cramping, or gas.  · Drink plenty of water. Water helps you to digest fiber.  What foods can I eat?  Grains  Whole-grain breads. Multigrain cereal. Oats and oatmeal. Brown rice. Barley. Bulgur wheat. Millet. Bran muffins. Popcorn. Rye wafer crackers.  Vegetables   Sweet potatoes. Spinach. Kale. Artichokes. Cabbage. Broccoli. Green peas. Carrots. Squash.  Fruits  Berries. Pears. Apples. Oranges. Avocados. Prunes and raisins. Dried figs.  Meats and Other Protein Sources  Navy, kidney, pinto, and soy beans. Split peas. Lentils. Nuts and seeds.  Dairy  Fiber-fortified yogurt.  Beverages  Fiber-fortified soy milk. Fiber-fortified orange juice.  Other  Fiber bars.  The items listed above may not be a complete list of recommended foods or beverages. Contact your dietitian for more options.  What foods are not recommended?  Grains  White bread. Pasta made with refined flour. White rice.  Vegetables  Fried potatoes. Canned vegetables. Well-cooked vegetables.  Fruits  Fruit juice. Cooked, strained fruit.  Meats and Other Protein Sources  Fatty cuts of meat. Fried poultry or fried fish.  Dairy  Milk. Yogurt. Cream cheese. Sour cream.  Beverages  Soft drinks.  Other  Cakes and pastries. Butter and oils.  The items listed above may not be a complete list of foods and beverages to avoid. Contact your dietitian for more information.  What are some tips for including high-fiber foods in my diet?  · Eat a wide variety of high-fiber foods.  · Make sure that half of all grains consumed each day are whole grains.  · Replace breads and cereals made from refined flour or white flour with whole-grain breads and cereals.  · Replace white rice with brown rice, bulgur wheat, or millet.  · Start the day with a breakfast that is high in fiber,   such as a cereal that contains at least 5 grams of fiber per serving.  · Use beans in place of meat in soups, salads, or pasta.  · Eat high-fiber snacks, such as berries, raw vegetables, nuts, or popcorn.  This information is not intended to replace advice given to you by your health care provider. Make sure you discuss any questions you have with your health care provider.  Document Released: 02/26/2005 Document Revised: 08/04/2015 Document Reviewed: 08/11/2013   Elsevier Interactive Patient Education © 2018 [employer]vier Inc.

## 2018-02-20 ENCOUNTER — Encounter: Payer: Self-pay | Admitting: *Deleted

## 2018-02-20 ENCOUNTER — Encounter: Payer: Self-pay | Admitting: Anesthesiology

## 2018-02-20 ENCOUNTER — Other Ambulatory Visit: Payer: Self-pay

## 2018-02-24 ENCOUNTER — Telehealth: Payer: Self-pay | Admitting: Gastroenterology

## 2018-02-24 NOTE — Telephone Encounter (Signed)
Pt would like to cancel her procedure on 02/27/19. Pls call pt

## 2018-02-26 SURGERY — COLONOSCOPY WITH PROPOFOL
Anesthesia: General

## 2018-02-28 NOTE — Telephone Encounter (Signed)
Patient contacted office on 12/16 to cancel her colonoscopy. No cancellation fee will be applied because she cancelled 48 hours prior to her procedure date of 12/18.  Thanks  [employer]

## 2018-03-17 ENCOUNTER — Telehealth: Payer: Self-pay | Admitting: Gastroenterology

## 2018-03-17 NOTE — Telephone Encounter (Signed)
I called patient to remind her of her appointment with Dr Allegra LaiVanga 03-18-2018 & she then states she will come to that appointment but needs to cancel her colonoscopy schedule on 03-19-2018 because of no insurance. She would like to reschedule so she will have the money.

## 2018-03-17 NOTE — Telephone Encounter (Signed)
Colonoscopy has been canceled with Courtney Ball at [facility]  Referral will be updated to reflect cancellation due to no insurance.  Thanks [employer]

## 2018-03-18 ENCOUNTER — Encounter: Payer: Self-pay | Admitting: Gastroenterology

## 2018-03-18 ENCOUNTER — Other Ambulatory Visit: Payer: Self-pay

## 2018-03-18 ENCOUNTER — Ambulatory Visit (INDEPENDENT_AMBULATORY_CARE_PROVIDER_SITE_OTHER): Payer: Medicaid Other | Admitting: Gastroenterology

## 2018-03-18 VITALS — BP 119/74 | HR 68

## 2018-03-18 DIAGNOSIS — K625 Hemorrhage of anus and rectum: Secondary | ICD-10-CM

## 2018-03-18 DIAGNOSIS — K5909 Other constipation: Secondary | ICD-10-CM

## 2018-03-18 NOTE — Progress Notes (Signed)
Courtney Repress, MD [address]  [address]  [place], [place] [address]  Main: [phone]  Fax: [phone]    Gastroenterology Consultation  Referring Provider:     Leim Fabry, MD Primary Care Physician:  Courtney Fabry, MD Primary Gastroenterologist:  Dr. Arlyss Ball Reason for Consultation:     Chronic constipation        HPI:   Courtney Ball is a 58 y.o. African-American female referred by Dr. Leim Fabry, MD  for consultation & management of chronic constipation.  Patient reports that she has been having frequent bowel movements for more than 10 years, usually goes about once every 1 to 2 weeks, associated with straining and hard stools.  She does report intermittent rectal bleeding generally on wiping only.  She denies weight loss, loss of appetite.  She does report abdominal pain associated with constipation.  She has tried Metamucil and MiraLAX which modestly helped.  She does not have anemia.  Never had a colonoscopy in the past  Follow-up visit 03/18/2018 Patient canceled her colonoscopy that is originally scheduled for tomorrow due to lack of insurance.  She is trying to apply for [facility] charity care program.  She reports that her constipation has improved on Linzess.  However, she was taking Linzess 290 MCG 2 times daily although it was not directed such way which led to some diarrhea.  She otherwise denies any complaints today.  She did not have any further episodes of rectal bleeding since last visit.  NSAIDs: None  Antiplts/Anticoagulants/Anti thrombotics: None  GI Procedures: None She denies family history of GI malignancy  Past Medical History:  Diagnosis Date  . Bradycardia   . Carpal tunnel syndrome   . Constipation   . Drug abuse (HCC)   . Heart murmur   . Valvular heart disease     Past Surgical History:  Procedure Laterality Date  . COLONOSCOPY    . HERNIA REPAIR  2006   ventral    Current Outpatient  Medications:  .  cyclobenzaprine (FLEXERIL) 10 MG tablet, Take by mouth., Disp: , Rfl:  .  Dextromethorphan-Guaifenesin 60-1200 MG 12hr tablet, Take by mouth., Disp: , Rfl:  .  linaCLOtide (LINZESS PO), Take by mouth 2 (two) times daily., Disp: , Rfl:  .  ketorolac (TORADOL) 10 MG tablet, Take 1 tablet (10 mg total) by mouth every 8 (eight) hours. (Patient not taking: Reported on 02/18/2018), Disp: 15 tablet, Rfl: 0 .  polyethylene glycol powder (GLYCOLAX/MIRALAX) powder, Take 1-2 capfuls (17-34g) daily.  Mix in 4-8ounces of fluid prior to taking., Disp: , Rfl:     Family History  Problem Relation Age of Onset  . Breast cancer Neg Hx      Social History   Tobacco Use  . Smoking status: Never Smoker  . Smokeless tobacco: Never Used  Substance Use Topics  . Alcohol use: No    Alcohol/week: 0.0 standard drinks  . Drug use: Yes    Types: Cocaine    Comment: cocaine    Allergies as of 03/18/2018 - Review Complete 03/18/2018  Allergen Reaction Noted  . Sulfa antibiotics Hives 04/22/2015    Review of Systems:    All systems reviewed and negative except where noted in HPI.   Physical Exam:  BP 119/74   Pulse 68   LMP 09/22/2014 (Approximate)  Patient's last menstrual period was 09/22/2014 (approximate).  General:   Alert,  Well-developed, well-nourished, pleasant and cooperative in NAD Head:  Normocephalic and  atraumatic. Eyes:  Sclera clear, no icterus.   Conjunctiva pink. Ears:  Normal auditory acuity. Nose:  No deformity, discharge, or lesions. Mouth:  No deformity or lesions,oropharynx pink & moist. Neck:  Supple; no masses or thyromegaly. Lungs:  Respirations even and unlabored.  Clear throughout to auscultation.   No wheezes, crackles, or rhonchi. No acute distress. Heart:  Regular rate and rhythm; no murmurs, clicks, rubs, or gallops. Abdomen:  Normal bowel sounds. Soft, non-tender and non-distended without masses, hepatosplenomegaly or hernias noted.  No guarding or  rebound tenderness.   Rectal: Not performed Msk:  Symmetrical without gross deformities. Good, equal movement & strength bilaterally. Pulses:  Normal pulses noted. Extremities:  No clubbing or edema.  No cyanosis. Neurologic:  Alert and oriented x3;  grossly normal neurologically. Skin:  Intact without significant lesions or rashes. No jaundice. Psych:  Alert and cooperative. Normal mood and affect.  Imaging Studies: None  Assessment and Plan:   Courtney Ball is a 58 y.o. African-American female with chronic constipation and intermittent rectal bleeding  Chronic constipation Discussed with her about high-fiber diet, handout provided Continue fiber supplements twice daily Continue linaclotide, samples for 145 MCG provided  Rectal bleeding Recommend colonoscopy as she is also overdue for colon cancer screening As patient does not have insurance, she is applying for charity care with [facility].  Will refer her to Child psychotherapist for assistance.  Advised patient to call my office back once it is approved to schedule colonoscopy   Follow up in 3 months   Courtney Repress, MD

## 2018-03-19 ENCOUNTER — Ambulatory Visit: Admission: RE | Admit: 2018-03-19 | Payer: Medicaid Other | Source: Home / Self Care | Admitting: Gastroenterology

## 2018-03-19 SURGERY — COLONOSCOPY WITH PROPOFOL
Anesthesia: Choice

## 2018-03-25 ENCOUNTER — Other Ambulatory Visit: Payer: Self-pay

## 2018-06-17 ENCOUNTER — Ambulatory Visit: Payer: Self-pay | Admitting: Gastroenterology

## 2019-07-06 ENCOUNTER — Other Ambulatory Visit: Payer: Self-pay

## 2019-07-06 ENCOUNTER — Ambulatory Visit: Payer: Self-pay | Admitting: Physician Assistant

## 2019-07-06 DIAGNOSIS — Z113 Encounter for screening for infections with a predominantly sexual mode of transmission: Secondary | ICD-10-CM

## 2019-07-06 DIAGNOSIS — A5901 Trichomonal vulvovaginitis: Secondary | ICD-10-CM

## 2019-07-06 LAB — WET PREP FOR TRICH, YEAST, CLUE
Trichomonas Exam: POSITIVE — AB
Yeast Exam: NEGATIVE

## 2019-07-06 MED ORDER — METRONIDAZOLE 500 MG PO TABS: 2000.0000 mg | ORAL_TABLET | Freq: Once | ORAL | 0 refills | Status: AC

## 2019-07-07 ENCOUNTER — Encounter: Payer: Self-pay | Admitting: Physician Assistant

## 2019-07-07 NOTE — Progress Notes (Signed)
  [facility] Department STI clinic/screening visit  Subjective:  Courtney Ball is a 59 y.o. female being seen today for an STI screening visit. The patient reports they do have symptoms.  Patient reports that they do not desire a pregnancy in the next year.   They reported they are not interested in discussing contraception today.  Patient's last menstrual period was 09/22/2014 (approximate).   Patient has the following medical conditions:   Patient Active Problem List   Diagnosis Date Noted  . Drug abuse (HCC) 02/18/2018  . Constipation 06/17/2012  . Carpal tunnel syndrome 12/29/2010    Chief Complaint  Patient presents with  . SEXUALLY TRANSMITTED DISEASE    HPI  Patient reports that she has had an increased amount of white discharge with odor for 2 weeks.  Denies other symptoms, chronic conditions and regular medications.  States that she has had hernia repair in 2006 and is postmenopausal.  Desires to self-collect samples for vaginal testing.   See flowsheet for further details and programmatic requirements.    The following portions of the patient's history were reviewed and updated as appropriate: allergies, current medications, past medical history, past social history, past surgical history and problem list.  Objective:  There were no vitals filed for this visit.  Physical Exam Constitutional:      General: She is not in acute distress.    Appearance: Normal appearance.  HENT:     Head: Normocephalic and atraumatic.     Comments: No nits, lice, or hair loss. No cervical, supraclavicular or axillary adenopathy.    Mouth/Throat:     Mouth: Mucous membranes are moist.     Pharynx: Oropharynx is clear. No oropharyngeal exudate or posterior oropharyngeal erythema.  Eyes:     Conjunctiva/sclera: Conjunctivae normal.  Pulmonary:     Effort: Pulmonary effort is normal.  Musculoskeletal:     Cervical back: Neck supple. No tenderness.  Neurological:      Mental Status: She is alert and oriented to person, place, and time.  Psychiatric:        Mood and Affect: Mood normal.        Behavior: Behavior normal.        Thought Content: Thought content normal.        Judgment: Judgment normal.    Counseled patient how to collect vaginal samples for GC/Chlamydia and wet mount testing.  Assessment and Plan:  Courtney Ball is a 59 y.o. female presenting to the [facility] Department for STI screening  1. Screening for STD (sexually transmitted disease) Patient into clinic with symptoms. Rec condoms with all sex. Await test results.  Counseled that RN will call if needs to RTC for further treatment once results are back. - WET PREP FOR TRICH, YEAST, CLUE - Chlamydia/Gonorrhea [place] [facility] - HIV/HCV [place] [facility] - Syphilis Serology, [place] [facility]  2. Vaginal trichomoniasis Will treat for Trich with Metronidazole 2 g po with food, no EtOH for 24 hr before and until 72 hr after completing medicine. No sex for 7 days and until after partner completes treatment. RTC for re-treatment if vomits < 2 hr after taking medicine. - metroNIDAZOLE (FLAGYL) 500 MG tablet; Take 4 tablets (2,000 mg total) by mouth once for 1 dose.  Dispense: 4 tablet; Refill: 0     No follow-ups on file.  No future appointments.  Matt Holmes, PA

## 2019-07-11 NOTE — Progress Notes (Signed)
Chart reviewed by Pharmacist  Suzanne Walker PharmD, Contract Pharmacist at [facility]  

## 2019-07-13 LAB — HM HIV SCREENING LAB: HM HIV Screening: NEGATIVE

## 2019-07-13 LAB — HM HEPATITIS C SCREENING LAB: HM Hepatitis Screen: NEGATIVE

## 2019-09-07 ENCOUNTER — Ambulatory Visit: Payer: Self-pay | Admitting: Advanced Practice Midwife

## 2019-09-07 ENCOUNTER — Other Ambulatory Visit: Payer: Self-pay

## 2019-09-07 DIAGNOSIS — F1290 Cannabis use, unspecified, uncomplicated: Secondary | ICD-10-CM | POA: Insufficient documentation

## 2019-09-07 DIAGNOSIS — A599 Trichomoniasis, unspecified: Secondary | ICD-10-CM

## 2019-09-07 DIAGNOSIS — F1490 Cocaine use, unspecified, uncomplicated: Secondary | ICD-10-CM

## 2019-09-07 DIAGNOSIS — Z113 Encounter for screening for infections with a predominantly sexual mode of transmission: Secondary | ICD-10-CM

## 2019-09-07 MED ORDER — METRONIDAZOLE 500 MG PO TABS: 2000.0000 mg | ORAL_TABLET | Freq: Once | ORAL | 0 refills | Status: AC

## 2019-09-07 NOTE — Progress Notes (Signed)
Wet mount reviewed, patient treated for trich per SO...Dmari Schubring Brewer-Jensen, RN 

## 2019-09-07 NOTE — Progress Notes (Signed)
[facility] Department STI clinic/screening visit  Subjective:  Courtney Ball is a 59 y.o. SBF nonsmoker G5P2  female being seen today for an STI screening visit. The patient reports they do have symptoms.  Patient reports that they do not desire a pregnancy in the next year.   They reported they are not interested in discussing contraception today.  Patient's last menstrual period was 09/22/2014 (approximate).   Patient has the following medical conditions:   Patient Active Problem List   Diagnosis Date Noted  . Drug abuse (HCC) 02/18/2018  . Constipation 06/17/2012  . Carpal tunnel syndrome 12/29/2010    No chief complaint on file.   HPI  Patient reports increased malodorous clear d/c x 3 days.  LMP 20 years ago.  Last sex 08/28/19 without condom.  Last MJ 2020.  Last cocaine 1 day ago.  Last HIV test per patient/review of record was 07/13/19 Patient reports last pap was unknown   See flowsheet for further details and programmatic requirements.    The following portions of the patient's history were reviewed and updated as appropriate: allergies, current medications, past medical history, past social history, past surgical history and problem list.  Objective:  There were no vitals filed for this visit.  Physical Exam Vitals and nursing note reviewed.  Constitutional:      Appearance: Normal appearance.  HENT:     Head: Normocephalic and atraumatic.     Mouth/Throat:     Mouth: Mucous membranes are moist.     Pharynx: Oropharynx is clear. No oropharyngeal exudate or posterior oropharyngeal erythema.  Eyes:     Conjunctiva/sclera: Conjunctivae normal.  Pulmonary:     Effort: Pulmonary effort is normal.  Abdominal:     General: Abdomen is flat.     Palpations: Abdomen is soft. There is no mass.     Tenderness: There is no abdominal tenderness. There is no rebound.     Comments: Soft without tenderness, good tone  Genitourinary:    General: Normal  vulva.     Exam position: Lithotomy position.     Pubic Area: No rash or pubic lice.      Labia:        Right: No rash or lesion.        Left: No rash or lesion.      Vagina: Normal. No vaginal discharge (large amt watery green-yellow leukorrhea, ph>4.5), erythema, bleeding or lesions.     Cervix: Normal.     Uterus: Normal.      Adnexa: Right adnexa normal and left adnexa normal.     Rectum: Normal.  Lymphadenopathy:     Head:     Right side of head: No preauricular or posterior auricular adenopathy.     Left side of head: No preauricular or posterior auricular adenopathy.     Cervical: No cervical adenopathy.     Upper Body:     Right upper body: No supraclavicular or axillary adenopathy.     Left upper body: No supraclavicular or axillary adenopathy.     Lower Body: No right inguinal adenopathy. No left inguinal adenopathy.  Skin:    General: Skin is warm and dry.     Findings: No rash.  Neurological:     Mental Status: She is alert and oriented to person, place, and time.      Assessment and Plan:  Courtney Ball is a 59 y.o. female presenting to the [facility] Department for STI screening  1.  Screening examination for venereal disease Treat wet mount per standing orders Immunization nurse consult - WET PREP FOR [place], YEAST, CLUE - HIV [place] [facility] - Chlamydia/Gonorrhea [place] [facility] - Syphilis Serology, [place] [facility]     No follow-ups on file.  No future appointments.  Herbie Saxon, CNM

## 2019-09-08 LAB — WET PREP FOR TRICH, YEAST, CLUE
Trichomonas Exam: POSITIVE — AB
Yeast Exam: NEGATIVE

## 2019-10-29 ENCOUNTER — Other Ambulatory Visit: Payer: Self-pay

## 2019-10-29 ENCOUNTER — Encounter: Payer: Self-pay | Admitting: Advanced Practice Midwife

## 2019-10-29 ENCOUNTER — Ambulatory Visit: Payer: Self-pay | Admitting: Advanced Practice Midwife

## 2019-10-29 DIAGNOSIS — F339 Major depressive disorder, recurrent, unspecified: Secondary | ICD-10-CM

## 2019-10-29 DIAGNOSIS — N760 Acute vaginitis: Secondary | ICD-10-CM

## 2019-10-29 DIAGNOSIS — A599 Trichomoniasis, unspecified: Secondary | ICD-10-CM

## 2019-10-29 DIAGNOSIS — B9689 Other specified bacterial agents as the cause of diseases classified elsewhere: Secondary | ICD-10-CM

## 2019-10-29 DIAGNOSIS — Z113 Encounter for screening for infections with a predominantly sexual mode of transmission: Secondary | ICD-10-CM

## 2019-10-29 LAB — WET PREP FOR TRICH, YEAST, CLUE
Trichomonas Exam: POSITIVE — AB
Yeast Exam: NEGATIVE

## 2019-10-29 MED ORDER — METRONIDAZOLE 500 MG PO TABS: 500.0000 mg | ORAL_TABLET | Freq: Two times a day (BID) | ORAL | 0 refills | Status: AC

## 2019-10-29 NOTE — Progress Notes (Signed)
[facility] Department STI clinic/screening visit  Subjective:  Courtney Ball is a 59 y.o. SBF G5P2 nonsmoker female being seen today for an STI screening visit. The patient reports they do have symptoms.  Patient reports that they do not desire a pregnancy in the next year.   They reported they are not interested in discussing contraception today.  Patient's last menstrual period was 09/22/2014 (approximate).   Patient has the following medical conditions:   Patient Active Problem List   Diagnosis Date Noted   Depression, dx'd 5-10 years ago 10/29/2019   Cocaine use 09/06/19 09/07/2019   Marijuana use qo day 09/07/2019   Drug abuse (HCC) 02/18/2018   Constipation 06/17/2012   Carpal tunnel syndrome 12/29/2010    No chief complaint on file.   HPI  Patient reports c/o malodor x 1 week.  Last sex 10/22/19 without condom; with current partner x 3 months.  Last MJ 2 days ago.  Last ETOH 3 years ago.  Last cocaine 09/06/19. LMP maybe 10 years ago   Last HIV test per patient/review of record was 09/07/19 Patient reports last pap was unknown  See flowsheet for further details and programmatic requirements.    The following portions of the patient's history were reviewed and updated as appropriate: allergies, current medications, past medical history, past social history, past surgical history and problem list.  Objective:  There were no vitals filed for this visit.  Physical Exam Vitals and nursing note reviewed.  Constitutional:      Appearance: Normal appearance.  HENT:     Head: Normocephalic and atraumatic.     Mouth/Throat:     Mouth: Mucous membranes are moist.     Pharynx: Oropharynx is clear. No oropharyngeal exudate or posterior oropharyngeal erythema.  Eyes:     Conjunctiva/sclera: Conjunctivae normal.  Pulmonary:     Effort: Pulmonary effort is normal.  Abdominal:     Palpations: Abdomen is soft. There is no mass.     Tenderness: There is  no abdominal tenderness. There is no rebound.     Comments: Soft without tenderness or masses, good tone  Genitourinary:    General: Normal vulva.     Exam position: Lithotomy position.     Pubic Area: No rash or pubic lice.      Labia:        Right: No rash or lesion.        Left: No rash or lesion.      Vagina: Normal. No vaginal discharge (large amt green thin malodorous leukorrhea,ph>4.5), erythema, bleeding or lesions.     Cervix: Normal.     Uterus: Normal.      Adnexa: Right adnexa normal and left adnexa normal.     Rectum: Normal.  Lymphadenopathy:     Head:     Right side of head: No preauricular or posterior auricular adenopathy.     Left side of head: No preauricular or posterior auricular adenopathy.     Cervical: No cervical adenopathy.     Upper Body:     Right upper body: No supraclavicular or axillary adenopathy.     Left upper body: No supraclavicular or axillary adenopathy.     Lower Body: No right inguinal adenopathy. No left inguinal adenopathy.  Skin:    General: Skin is warm and dry.     Findings: No rash.  Neurological:     Mental Status: She is alert and oriented to person, place, and time.  Assessment and Plan:  Courtney Ball is a 59 y.o. female presenting to the [facility] Department for STI screening  1. Screening examination for venereal disease Treat wet mount per standing orders Immunization nurse consult - WET PREP FOR TRICH, YEAST, CLUE - Syphilis Serology, [place] - Chlamydia/Gonorrhea [place] [facility] - HIV/HCV  Lab  2. Depression, dx'd 5-10 years ago Declines counseling     No follow-ups on file.  No future appointments.  Alberteen Spindle, CNM

## 2019-10-29 NOTE — Progress Notes (Signed)
Wet mount reviewed, pt treated for Trich and BV per standing order. Provider orders completed.

## 2019-11-03 LAB — GONOCOCCUS CULTURE

## 2020-02-09 ENCOUNTER — Other Ambulatory Visit: Payer: Self-pay

## 2020-02-09 ENCOUNTER — Emergency Department
Admission: EM | Admit: 2020-02-09 | Discharge: 2020-02-09 | Disposition: A | Discharge status: Home / Self Care | Payer: Self-pay | Attending: Emergency Medicine | Admitting: Emergency Medicine

## 2020-02-09 ENCOUNTER — Encounter: Payer: Self-pay | Admitting: Emergency Medicine

## 2020-02-09 ENCOUNTER — Emergency Department: Payer: Self-pay

## 2020-02-09 DIAGNOSIS — Z79899 Other long term (current) drug therapy: Secondary | ICD-10-CM | POA: Insufficient documentation

## 2020-02-09 DIAGNOSIS — M5432 Sciatica, left side: Secondary | ICD-10-CM | POA: Insufficient documentation

## 2020-02-09 MED ORDER — ACETAMINOPHEN 500 MG PO TABS
1000.0000 mg | ORAL_TABLET | Freq: Once | ORAL | Status: AC
  Administered 2020-02-09: 1000 mg via ORAL
  Filled 2020-02-09: qty 2

## 2020-02-09 MED ORDER — METHOCARBAMOL 500 MG PO TABS
750.0000 mg | ORAL_TABLET | Freq: Once | ORAL | Status: AC
  Administered 2020-02-09: 750 mg via ORAL
  Filled 2020-02-09: qty 2

## 2020-02-09 MED ORDER — KETOROLAC TROMETHAMINE 60 MG/2ML IM SOLN
30.0000 mg | Freq: Once | INTRAMUSCULAR | Status: AC
  Administered 2020-02-09: 30 mg via INTRAMUSCULAR
  Filled 2020-02-09: qty 2

## 2020-02-09 MED ORDER — METHOCARBAMOL 750 MG PO TABS: 750.0000 mg | ORAL_TABLET | Freq: Four times a day (QID) | ORAL | 0 refills | Status: AC | PRN

## 2020-02-09 MED ORDER — MELOXICAM 15 MG PO TABS: 15.0000 mg | ORAL_TABLET | Freq: Every day | ORAL | 0 refills | Status: AC

## 2020-02-09 NOTE — ED Notes (Signed)
Pt reports lower left sided back pain that radiates down and around left leg. Pt states she has a hx of sciatica and this feels similar. Pt denies any urinary pain or frequency. Pt also states she thinks she is dehydrated due to her skin looking mor dry than normal. Pt states she has been drinking more soda than water recently. Pt ambulatory on arrival and A&O x 4.

## 2020-02-09 NOTE — ED Provider Notes (Signed)
[facility] Emergency Department Provider Note  ____________________________________________   First MD Initiated Contact with Patient 02/09/20 1842     (approximate)  I have reviewed the triage vital signs and the nursing notes.   HISTORY  Chief Complaint Back Pain  HPI Courtney Ball is a 59 y.o. female reports to the emergency department for evaluation of left posterior buttock pain that radiates into the left groin and lateral hip.  The patient states the pain has been present for approximately 1 week.  She has tried Tylenol without any relief.  She denies any low back pain, denies any loss of bowel or bladder function, denies any saddle anesthesia.  She states that pain is worse with ambulation as well as lying directly on the left side.  She states that she is typically a side sleeper on the left side and this has been worsening her pain.  She denies any new traumas or falls.  Her pain is currently rated a 10/10.       Past Medical History:  Diagnosis Date  . Bradycardia   . Carpal tunnel syndrome   . Constipation   . Drug abuse (HCC)   . Heart murmur   . Valvular heart disease     Patient Active Problem List   Diagnosis Date Noted  . Depression, dx'd 5-10 years ago 10/29/2019  . Cocaine use 09/06/19 09/07/2019  . Marijuana use qo day 09/07/2019  . Drug abuse (HCC) 02/18/2018  . Constipation 06/17/2012  . Carpal tunnel syndrome 12/29/2010    Past Surgical History:  Procedure Laterality Date  . COLONOSCOPY    . HERNIA REPAIR  2006   ventral    Prior to Admission medications   Medication Sig Start Date End Date Taking? Authorizing Provider  Dextromethorphan-Guaifenesin 60-1200 MG 12hr tablet Take by mouth. 02/26/18   [provider]  linaCLOtide (LINZESS PO) Take by mouth 2 (two) times daily.    [provider]  meloxicam (MOBIC) 15 MG tablet Take 1 tablet (15 mg total) by mouth daily for 15 days. 02/09/20  02/24/20  Lucy Chris, PA  methocarbamol (ROBAXIN-750) 750 MG tablet Take 1 tablet (750 mg total) by mouth 4 (four) times daily as needed for up to 10 days for muscle spasms. 02/09/20 02/19/20  Lucy Chris, PA  polyethylene glycol powder (GLYCOLAX/MIRALAX) powder Take 1-2 capfuls (17-34g) daily.  Mix in 4-8ounces of fluid prior to taking. 07/02/16   [provider]    Allergies Sulfa antibiotics  Family History  Problem Relation Age of Onset  . Breast cancer Neg Hx     Social History Social History   Tobacco Use  . Smoking status: Never Smoker  . Smokeless tobacco: Never Used  Vaping Use  . Vaping Use: Never used  Substance Use Topics  . Alcohol use: Not Currently    Alcohol/week: 0.0 standard drinks    Comment: "3 years ago"  . Drug use: Yes    Types: Cocaine, Marijuana    Comment: cocaine    Review of Systems Constitutional: No fever/chills Eyes: No visual changes. ENT: No sore throat. Cardiovascular: Denies chest pain. Respiratory: Denies shortness of breath. Gastrointestinal: No abdominal pain.  No nausea, no vomiting.  No diarrhea.  No constipation. Genitourinary: Negative for dysuria. Musculoskeletal: + Left buttock and hip pain, negative for back pain. Skin: Negative for rash. Neurological: Negative for headaches, focal weakness or numbness.  ____________________________________________   PHYSICAL EXAM:  VITAL SIGNS: ED Triage Vitals  Enc Vitals Group     BP 02/09/20 1813 131/75     Pulse Rate 02/09/20 1813 69     Resp 02/09/20 1813 16     Temp 02/09/20 1813 98.4 F (36.9 C)     Temp Source 02/09/20 1813 Oral     SpO2 02/09/20 1813 100 %     Weight 02/09/20 1813 134 lb (60.8 kg)     Height 02/09/20 1813 5\' 4"  (1.626 m)     Head Circumference --      Peak Flow --      Pain Score 02/09/20 1819 10     Pain Loc --      Pain Edu? --      Excl. in GC? --     Constitutional: Alert and oriented. Well appearing and in no acute  distress. Eyes: Conjunctivae are normal. PERRL. EOMI. Head: Atraumatic. Nose: No congestion/rhinnorhea. Mouth/Throat: Mucous membranes are moist.  Neck: No stridor.   Cardiovascular: Normal rate, regular rhythm.   Good peripheral circulation. Respiratory: Normal respiratory effort.  No retractions.  Gastrointestinal: Soft and nontender. No distention. No abdominal bruits. No CVA tenderness. Musculoskeletal: Patient has no midline tenderness of the lumbar spine, no tenderness of the paraspinal muscles.  There is tenderness of the left SI joint, left piriformis and left greater trochanter region.  The patient has a negative logroll of the left hip.  Lower extremity strength is 5/5 bilaterally in ankle plantarflexion, dorsiflexion, knee flexion, knee extension and hip flexion. Neurologic:  Normal speech and language. No gross focal neurologic deficits are appreciated. No gait instability. Skin:  Skin is warm, dry and intact. No rash noted. Psychiatric: Mood and affect are normal. Speech and behavior are normal.   ____________________________________________  RADIOLOGY I, 02/11/20, personally viewed and evaluated these images (plain radiographs) as part of my medical decision making, as well as reviewing the written report by the radiologist.  ED provider interpretation: No acute fracture identified.  Official radiology report(s): DG Hip Unilat W or Wo Pelvis 2-3 Views Left  Result Date: 02/09/2020 CLINICAL DATA:  Left hip pain for 1 week, no known injury, initial encounter EXAM: DG HIP (WITH OR WITHOUT PELVIS) 2-3V LEFT COMPARISON:  None. FINDINGS: Pelvic ring is intact. No acute fracture or dislocation is noted. No soft tissue abnormality is seen. IMPRESSION: No acute abnormality noted Electronically Signed   By: 02/11/2020 M.D.   On: 02/09/2020 19:52   ____________________________________________   INITIAL IMPRESSION / ASSESSMENT AND PLAN / ED COURSE  As part of my medical  decision making, I reviewed the following data within the electronic MEDICAL RECORD NUMBER Nursing notes reviewed and incorporated, Radiograph reviewed and Notes from prior ED visits        Patient is a 59 year old female who presents to the emergency department today for evaluation of left buttock and hip pain that has been present for about a week.  See HPI for further details.  On physical exam, the patient does not have any tenderness to palpation of the lumbar spine or paraspinal region.  She does have tenderness of the SI, piriformis and greater trochanter.  X-ray of the hip is negative for any acute fractures.  This is likely musculoskeletal pain, possibly related to SI joint versus greater trochanteric bursitis versus sciatica.  Discussed the findings with the patient and treatment for musculoskeletal pain including anti-inflammatory and muscle relaxant.  The patient is amenable with a trial of this and will follow up with primary care  if this does not improve her symptoms.  She will return to the emergency department for any acute worsening.      ____________________________________________   FINAL CLINICAL IMPRESSION(S) / ED DIAGNOSES  Final diagnoses:  Sciatica of left side     ED Discharge Orders         Ordered    methocarbamol (ROBAXIN-750) 750 MG tablet  4 times daily PRN        02/09/20 2005    meloxicam (MOBIC) 15 MG tablet  Daily        02/09/20 2005          *Please note:  Courtney Ball was evaluated in Emergency Department on 02/09/2020 for the symptoms described in the history of present illness. She was evaluated in the context of the global COVID-19 pandemic, which necessitated consideration that the patient might be at risk for infection with the SARS-CoV-2 virus that causes COVID-19. Institutional protocols and algorithms that pertain to the evaluation of patients at risk for COVID-19 are in a state of rapid change based on information released by regulatory  bodies including the CDC and federal and state organizations. These policies and algorithms were followed during the patient's care in the ED.  Some ED evaluations and interventions may be delayed as a result of limited staffing during and the pandemic.*   Note:  This document was prepared using Dragon voice recognition software and may include unintentional dictation errors.    Lucy Chris, PA 02/09/20 1696    Arnaldo Natal, MD 02/11/20 1057

## 2020-02-09 NOTE — ED Triage Notes (Signed)
Pt presents to ED via POV with c/o L sided lower back pain that starts at the top of the buttocks and radiates down her L leg. Pt states pain worse with walking at this time.

## 2020-04-04 ENCOUNTER — Encounter: Payer: Self-pay | Admitting: Family Medicine

## 2020-04-04 ENCOUNTER — Other Ambulatory Visit: Payer: Self-pay

## 2020-04-04 ENCOUNTER — Ambulatory Visit: Payer: Self-pay | Admitting: Family Medicine

## 2020-04-04 DIAGNOSIS — F1490 Cocaine use, unspecified, uncomplicated: Secondary | ICD-10-CM

## 2020-04-04 DIAGNOSIS — Z780 Asymptomatic menopausal state: Secondary | ICD-10-CM | POA: Insufficient documentation

## 2020-04-04 DIAGNOSIS — A599 Trichomoniasis, unspecified: Secondary | ICD-10-CM

## 2020-04-04 DIAGNOSIS — Z113 Encounter for screening for infections with a predominantly sexual mode of transmission: Secondary | ICD-10-CM

## 2020-04-04 DIAGNOSIS — N898 Other specified noninflammatory disorders of vagina: Secondary | ICD-10-CM

## 2020-04-04 LAB — HM HEPATITIS C SCREENING LAB: HM Hepatitis Screen: NEGATIVE

## 2020-04-04 LAB — WET PREP FOR TRICH, YEAST, CLUE
Trichomonas Exam: POSITIVE — AB
Yeast Exam: NEGATIVE

## 2020-04-04 LAB — HIV ANTIBODY (ROUTINE TESTING W REFLEX): HIV 1&2 Ab, 4th Generation: NONREACTIVE

## 2020-04-04 MED ORDER — METRONIDAZOLE 500 MG PO TABS: 500.0000 mg | ORAL_TABLET | Freq: Two times a day (BID) | ORAL | 0 refills | Status: AC

## 2020-04-04 NOTE — Progress Notes (Signed)
Patient here for STD screening. Accompanied by her grandson.Burt Knack, RN

## 2020-04-04 NOTE — Progress Notes (Signed)
[facility] Department STI clinic/screening visit  Subjective:  Bernarda Erck Dietrick is a 60 y.o. female being seen today for an STI screening visit. The patient reports they do have symptoms. Patient's last menstrual period was 09/22/2014 (approximate).   Patient has the following medical conditions:   Patient Active Problem List   Diagnosis Date Noted  . Postmenopausal 04/04/2020  . Depression, dx'd 5-10 years ago 10/29/2019  . Cocaine use  09/07/2019  . Marijuana use qo day 09/07/2019  . Drug abuse (HCC) 02/18/2018  . Constipation 06/17/2012  . Carpal tunnel syndrome 12/29/2010    Chief Complaint  Patient presents with  . SEXUALLY TRANSMITTED DISEASE    HPI  Patient reports discharge for 2-3 days, with odor. Feels like past "BV"  Last HIV test per patient/review of record was 10/2019 Patient reports last pap was years ago-- is postmenopausal and no insurance.   See flowsheet for further details and programmatic requirements.    The following portions of the patient's history were reviewed and updated as appropriate: allergies, current medications, past medical history, past social history, past surgical history and problem list.  Objective:  There were no vitals filed for this visit.  Physical Exam Vitals and nursing note reviewed.  Constitutional:      Appearance: Normal appearance.  HENT:     Head: Normocephalic and atraumatic.     Mouth/Throat:     Mouth: Mucous membranes are moist.     Pharynx: Oropharynx is clear. No oropharyngeal exudate or posterior oropharyngeal erythema.  Pulmonary:     Effort: Pulmonary effort is normal.  Chest:  Breasts:     Right: No axillary adenopathy or supraclavicular adenopathy.     Left: No axillary adenopathy or supraclavicular adenopathy.    Abdominal:     General: Abdomen is flat.     Palpations: There is no mass.     Tenderness: There is no abdominal tenderness. There is no rebound.  Genitourinary:     General: Normal vulva.     Exam position: Lithotomy position.     Pubic Area: No rash or pubic lice.      Labia:        Right: No rash or lesion.        Left: No rash or lesion.      Vagina: Vaginal discharge (copious green creamy discharge. pH >4.5) present. No erythema, bleeding or lesions.     Cervix: No cervical motion tenderness, discharge, friability, lesion or erythema.     Uterus: Normal.      Adnexa: Right adnexa normal and left adnexa normal.     Rectum: Normal.  Lymphadenopathy:     Head:     Right side of head: No preauricular or posterior auricular adenopathy.     Left side of head: No preauricular or posterior auricular adenopathy.     Cervical: No cervical adenopathy.     Upper Body:     Right upper body: No supraclavicular or axillary adenopathy.     Left upper body: No supraclavicular or axillary adenopathy.     Lower Body: No right inguinal adenopathy. No left inguinal adenopathy.  Skin:    General: Skin is warm and dry.     Findings: No rash.  Neurological:     Mental Status: She is alert and oriented to person, place, and time.      Assessment and Plan:  Danielle Mink Mifsud is a 60 y.o. female presenting to the [facility] Department for STI  screening  1. Screening examination for venereal disease Desires all blood work screening Needs HCV screening due to active drug use Oral and vaginal GC/CT testing today - WET PREP FOR TRICH, YEAST, CLUE - Syphilis Serology, [place] [facility] - HIV/HCV [place] [facility] - Chlamydia/Gonorrhea [place] [facility] - Chlamydia/Gonorrhea [facility]  2. Cocaine use  - using every 3 days - HIV/HCV [place] [facility] - Declines referral to LCSW but would like [employer] card.    3. Vaginal discharge Treat per [facility] for BV  Treat wet mount per standing, suspicious for trich - Chlamydia/Gonorrhea [facility]  Return if symptoms worsen or fail to improve.  No future appointments.  Federico Flake, MD

## 2020-04-04 NOTE — Progress Notes (Signed)
Wet mount reviewed, patient treated for Trich per standing order. [facility] card given.Burt Knack, RN

## 2020-04-12 LAB — HM HEPATITIS C SCREENING LAB: HM Hepatitis Screen: NEGATIVE

## 2020-04-12 LAB — HM HIV SCREENING LAB: HM HIV Screening: NEGATIVE

## 2020-05-31 NOTE — Progress Notes (Signed)
RN abstracted HIV and Hep c results. Harvie Heck, RN

## 2020-11-28 ENCOUNTER — Emergency Department: Payer: Self-pay

## 2020-11-28 ENCOUNTER — Emergency Department
Admission: EM | Admit: 2020-11-28 | Discharge: 2020-11-28 | Disposition: A | Discharge status: Home / Self Care | Payer: Self-pay | Attending: Emergency Medicine | Admitting: Emergency Medicine

## 2020-11-28 ENCOUNTER — Other Ambulatory Visit: Payer: Self-pay

## 2020-11-28 DIAGNOSIS — R0789 Other chest pain: Secondary | ICD-10-CM | POA: Insufficient documentation

## 2020-11-28 DIAGNOSIS — M25511 Pain in right shoulder: Secondary | ICD-10-CM | POA: Insufficient documentation

## 2020-11-28 LAB — CBC
HCT: 38.1 % (ref 36.0–46.0)
Hemoglobin: 12.5 g/dL (ref 12.0–15.0)
MCH: 29.8 pg (ref 26.0–34.0)
MCHC: 32.8 g/dL (ref 30.0–36.0)
MCV: 90.7 fL (ref 80.0–100.0)
Platelets: 273 10*3/uL (ref 150–400)
RBC: 4.2 MIL/uL (ref 3.87–5.11)
RDW: 13.9 % (ref 11.5–15.5)
WBC: 4.5 10*3/uL (ref 4.0–10.5)
nRBC: 0 % (ref 0.0–0.2)

## 2020-11-28 LAB — BASIC METABOLIC PANEL
Anion gap: 4 — ABNORMAL LOW (ref 5–15)
BUN: 17 mg/dL (ref 6–20)
CO2: 24 mmol/L (ref 22–32)
Calcium: 8.5 mg/dL — ABNORMAL LOW (ref 8.9–10.3)
Chloride: 108 mmol/L (ref 98–111)
Creatinine, Ser: 0.76 mg/dL (ref 0.44–1.00)
GFR, Estimated: >60 mL/min (ref >60)
Glucose, Bld: 163 mg/dL — ABNORMAL HIGH (ref 70–99)
Potassium: 3.3 mmol/L — ABNORMAL LOW (ref 3.5–5.1)
Sodium: 136 mmol/L (ref 135–145)

## 2020-11-28 LAB — TROPONIN I (HIGH SENSITIVITY): Troponin I (High Sensitivity): <2 ng/L (ref <18)

## 2020-11-28 MED ORDER — KETOROLAC TROMETHAMINE 60 MG/2ML IM SOLN
30.0000 mg | Freq: Once | INTRAMUSCULAR | Status: AC
  Administered 2020-11-28: 30 mg via INTRAMUSCULAR
  Filled 2020-11-28: qty 2

## 2020-11-28 MED ORDER — KETOROLAC TROMETHAMINE 10 MG PO TABS: 10.0000 mg | ORAL_TABLET | Freq: Four times a day (QID) | ORAL | 0 refills | Status: DC | PRN

## 2020-11-28 MED ORDER — ORPHENADRINE CITRATE 30 MG/ML IJ SOLN
60.0000 mg | Freq: Two times a day (BID) | INTRAMUSCULAR | Status: DC
  Administered 2020-11-28: 60 mg via INTRAMUSCULAR
  Filled 2020-11-28: qty 2

## 2020-11-28 MED ORDER — TIZANIDINE HCL 4 MG PO TABS: 4.0000 mg | ORAL_TABLET | Freq: Three times a day (TID) | ORAL | 0 refills | Status: DC

## 2020-11-28 NOTE — ED Provider Notes (Signed)
[facility] Emergency Department Provider Note ____________________________________________   Event Date/Time   First MD Initiated Contact with Patient 11/28/20 2151     (approximate)  I have reviewed the triage vital signs and the nursing notes.   HISTORY  Chief Complaint Chest Pain  HPI Courtney Ball is a 60 y.o. female with history of bradycardia, cocaine abuse and marijuana use presents to the emergency department for treatment and evaluation right arm and chest wall pain that started about a week ago.  Pain increases with movement, touch, and deep breath.  No relief with Tylenol..         Past Medical History:  Diagnosis Date   Bradycardia    Carpal tunnel syndrome    Constipation    Drug abuse (HCC)    Heart murmur    Valvular heart disease     Patient Active Problem List   Diagnosis Date Noted   Postmenopausal 04/04/2020   Depression, dx'd 5-10 years ago 10/29/2019   Cocaine use  09/07/2019   Marijuana use qo day 09/07/2019   Drug abuse (HCC) 02/18/2018   Constipation 06/17/2012   Carpal tunnel syndrome 12/29/2010    Past Surgical History:  Procedure Laterality Date   COLONOSCOPY     HERNIA REPAIR  2006   ventral    Prior to Admission medications   Medication Sig Start Date End Date Taking? Authorizing Provider  ketorolac (TORADOL) 10 MG tablet Take 1 tablet (10 mg total) by mouth every 6 (six) hours as needed. 11/28/20  Yes Caylon Saine B, FNP  tiZANidine (ZANAFLEX) 4 MG tablet Take 1 tablet (4 mg total) by mouth 3 (three) times daily. 11/28/20  Yes Jahan Friedlander B, FNP  Dextromethorphan-Guaifenesin 60-1200 MG 12hr tablet Take by mouth. Patient not taking: Reported on 04/04/2020 02/26/18   [provider]  linaCLOtide (LINZESS PO) Take by mouth 2 (two) times daily. Patient not taking: Reported on 04/04/2020    [provider]  polyethylene glycol powder (GLYCOLAX/MIRALAX) powder Take 1-2 capfuls  (17-34g) daily.  Mix in 4-8ounces of fluid prior to taking. Patient not taking: Reported on 04/04/2020 07/02/16   [provider]    Allergies Sulfa antibiotics  Family History  Problem Relation Age of Onset   Breast cancer Neg Hx     Social History Social History   Tobacco Use   Smoking status: Never   Smokeless tobacco: Never  Vaping Use   Vaping Use: Never used  Substance Use Topics   Alcohol use: Not Currently    Alcohol/week: 0.0 standard drinks    Comment: "3 years ago"   Drug use: Yes    Types: Cocaine, Marijuana    Comment: cocaine    Review of Systems  Constitutional: No fever/chills Eyes: No visual changes. ENT: No sore throat. Cardiovascular: Positive for chest pain.  Negative for palpitations. Respiratory: Denies shortness of breath. Gastrointestinal: No abdominal pain.  No nausea, no vomiting.  No diarrhea.  No constipation. Genitourinary: Negative for dysuria. Musculoskeletal: Positive for right side upper pain. Skin: Negative for rash. Neurological: Negative for headaches, focal weakness or numbness  ____________________________________________   PHYSICAL EXAM:  VITAL SIGNS: ED Triage Vitals  Enc Vitals Group     BP 11/28/20 1854 (!) 128/97     Pulse Rate 11/28/20 1854 69     Resp 11/28/20 1854 18     Temp 11/28/20 1854 97.8 F (36.6 C)     Temp Source 11/28/20 1854 Oral  SpO2 11/28/20 1854 100 %     Weight 11/28/20 1853 130 lb (59 kg)     Height 11/28/20 1853 5\' 3"  (1.6 m)     Head Circumference --      Peak Flow --      Pain Score 11/28/20 1852 10     Pain Loc --      Pain Edu? --      Excl. in [place]? --     Constitutional: Alert and oriented. Well appearing and in no acute distress. Eyes: Conjunctivae are normal. PERRL. EOMI. Head: Atraumatic. Nose: No congestion/rhinnorhea. Mouth/Throat: Mucous membranes are moist.  Oropharynx non-erythematous. Neck: No stridor.   Hematological/Lymphatic/Immunilogical: No cervical  lymphadenopathy. Cardiovascular: Normal rate, regular rhythm. Grossly normal heart sounds.  Good peripheral circulation. Respiratory: Normal respiratory effort.  No retractions. Lungs CTAB. Gastrointestinal: Soft and nontender. No distention. No abdominal bruits. Genitourinary:  Musculoskeletal: Pain over the right upper chest, anterior and posterior shoulder and subscapular pain increases with touch, movement, and deep breath.  No neck pain. Neurologic:  Normal speech and language. No gross focal neurologic deficits are appreciated. No gait instability. Skin:  Skin is warm, dry and intact. No rash noted. Psychiatric: Mood and affect are normal. Speech and behavior are normal.  ____________________________________________   LABS (all labs ordered are listed, but only abnormal results are displayed)  Labs Reviewed  BASIC METABOLIC PANEL - Abnormal; Notable for the following components:      Result Value   Potassium 3.3 (*)    Glucose, Bld 163 (*)    Calcium 8.5 (*)    Anion gap 4 (*)    All other components within normal limits  CBC  TROPONIN I (HIGH SENSITIVITY)   ____________________________________________  EKG  ED ECG REPORT I, Yahshua Thibault, FNP-BC personally viewed and interpreted this ECG.   Date: 11/28/2020  EKG Time: 1858  Rate: 66  Rhythm: normal EKG, normal sinus rhythm  Axis: Normal  Intervals:none  ST&T Change: No ST elevation  ____________________________________________  RADIOLOGY  ED MD interpretation:    Chest x-ray negative for acute cardiopulmonary abnormality.  I, 11/30/2020, personally viewed and evaluated these images (plain radiographs) as part of my medical decision making, as well as reviewing the written report by the radiologist.  Official radiology report(s): DG Chest 2 View  Result Date: 11/28/2020 CLINICAL DATA:  60 year old female with history of chest pain radiating into the right arm for the past 2 weeks. EXAM: CHEST - 2 VIEW  COMPARISON:  Chest x-ray 12/12/2009. FINDINGS: Lung volumes are normal. No consolidative airspace disease. No pleural effusions. No pneumothorax. No pulmonary nodule or mass noted. Pulmonary vasculature and the cardiomediastinal silhouette are within normal limits. IMPRESSION: No radiographic evidence of acute cardiopulmonary disease. Electronically Signed   By: 02/11/2010 M.D.   On: 11/28/2020 19:39    ____________________________________________   PROCEDURES  Procedure(s) performed (including Critical Care):  Procedures  ____________________________________________   INITIAL IMPRESSION / ASSESSMENT AND PLAN     60 year old female presenting to the emergency department for treatment and evaluation of right side upper chest and shoulder/arm pain that started about a week ago.  DIFFERENTIAL DIAGNOSIS  Atypical chest pain; musculoskeletal pain; cardiac event.  ED COURSE  Patient feels some better after Toradol and Norflex.  She feels comfortable going home.  She will be discharged with prescriptions for Zanaflex and Toradol.  She was encouraged to follow-up with her primary care provider or return to the emergency department for symptoms that change or  worsen.    ___________________________________________   FINAL CLINICAL IMPRESSION(S) / ED DIAGNOSES  Final diagnoses:  Chest wall pain     ED Discharge Orders          Ordered    tiZANidine (ZANAFLEX) 4 MG tablet  3 times daily        11/28/20 2303    ketorolac (TORADOL) 10 MG tablet  Every 6 hours PRN       Note to Pharmacy: Injection given in ER   11/28/20 2303             Courtney Ball was evaluated in Emergency Department on 11/28/2020 for the symptoms described in the history of present illness. She was evaluated in the context of the global COVID-19 pandemic, which necessitated consideration that the patient might be at risk for infection with the SARS-CoV-2 virus that causes COVID-19.  Institutional protocols and algorithms that pertain to the evaluation of patients at risk for COVID-19 are in a state of rapid change based on information released by regulatory bodies including the CDC and federal and state organizations. These policies and algorithms were followed during the patient's care in the ED.   Note:  This document was prepared using Dragon voice recognition software and may include unintentional dictation errors.    Chinita Pester, FNP 11/28/20 2305    Phineas Semen, MD 11/29/20 [phone]

## 2020-11-28 NOTE — ED Triage Notes (Signed)
Pt to ED for right sided cp radiating down right arm for the past 2 weeks. Denies n/v/shob Ambulatory  Last cocaine use 3 days ago

## 2020-12-31 IMAGING — CR DG HIP (WITH OR WITHOUT PELVIS) 2-3V*L*
3 series · 3 of 3 positions shown · non-contrast
Comparison: None.

CLINICAL DATA: Left hip pain for 1 week, no known injury, initial
encounter

EXAM:
DG HIP (WITH OR WITHOUT PELVIS) 2-3V LEFT

[hip ap]
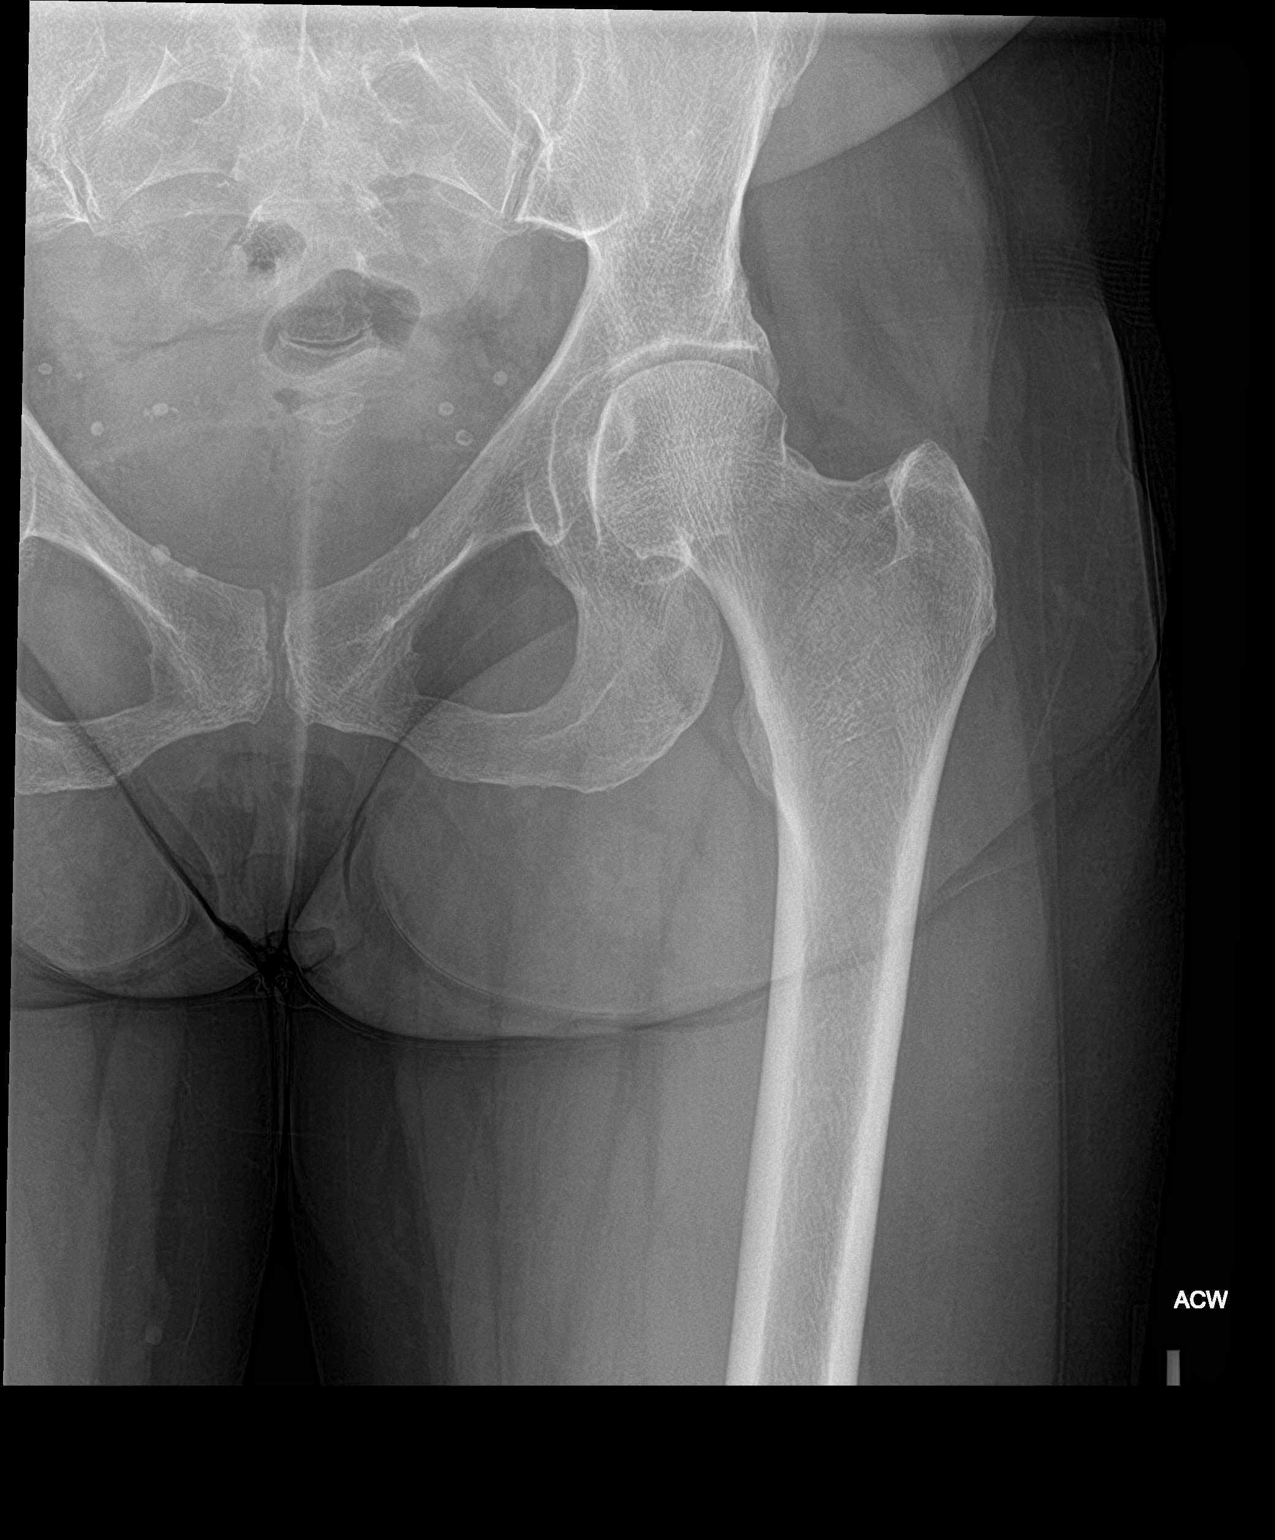

[hip lat]
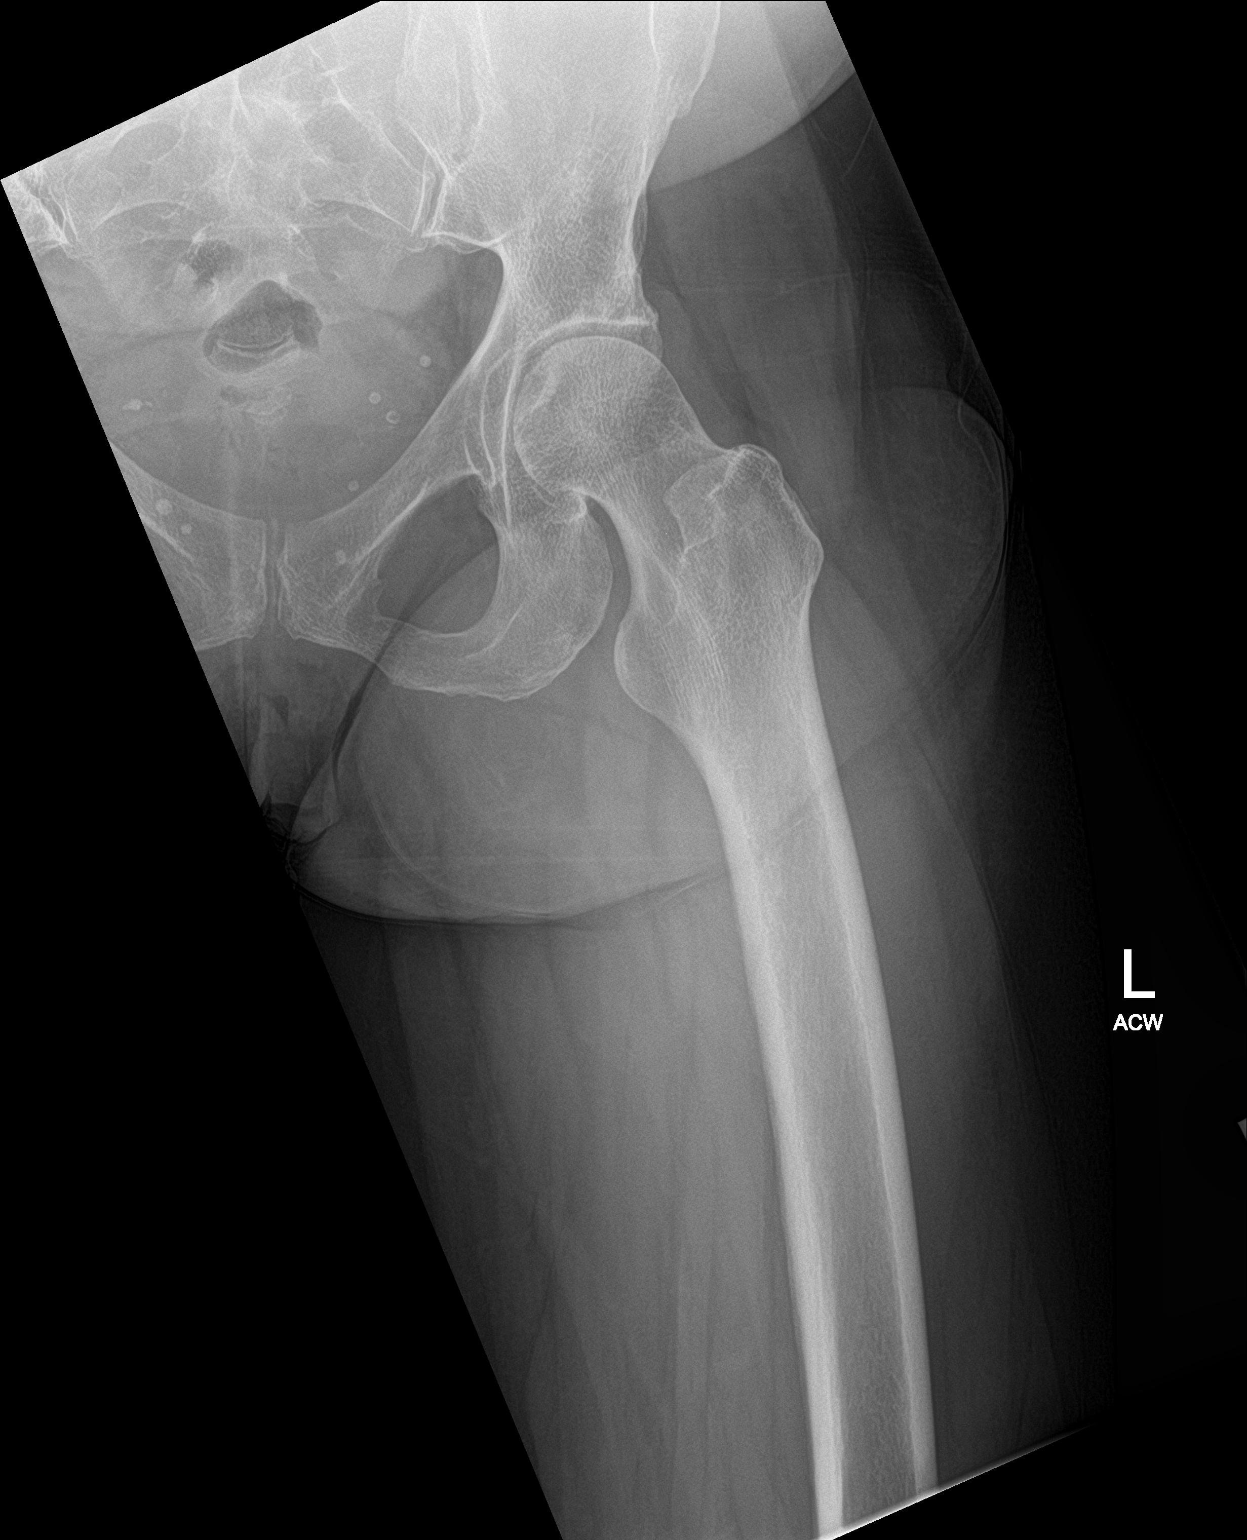

[pelvis ap]
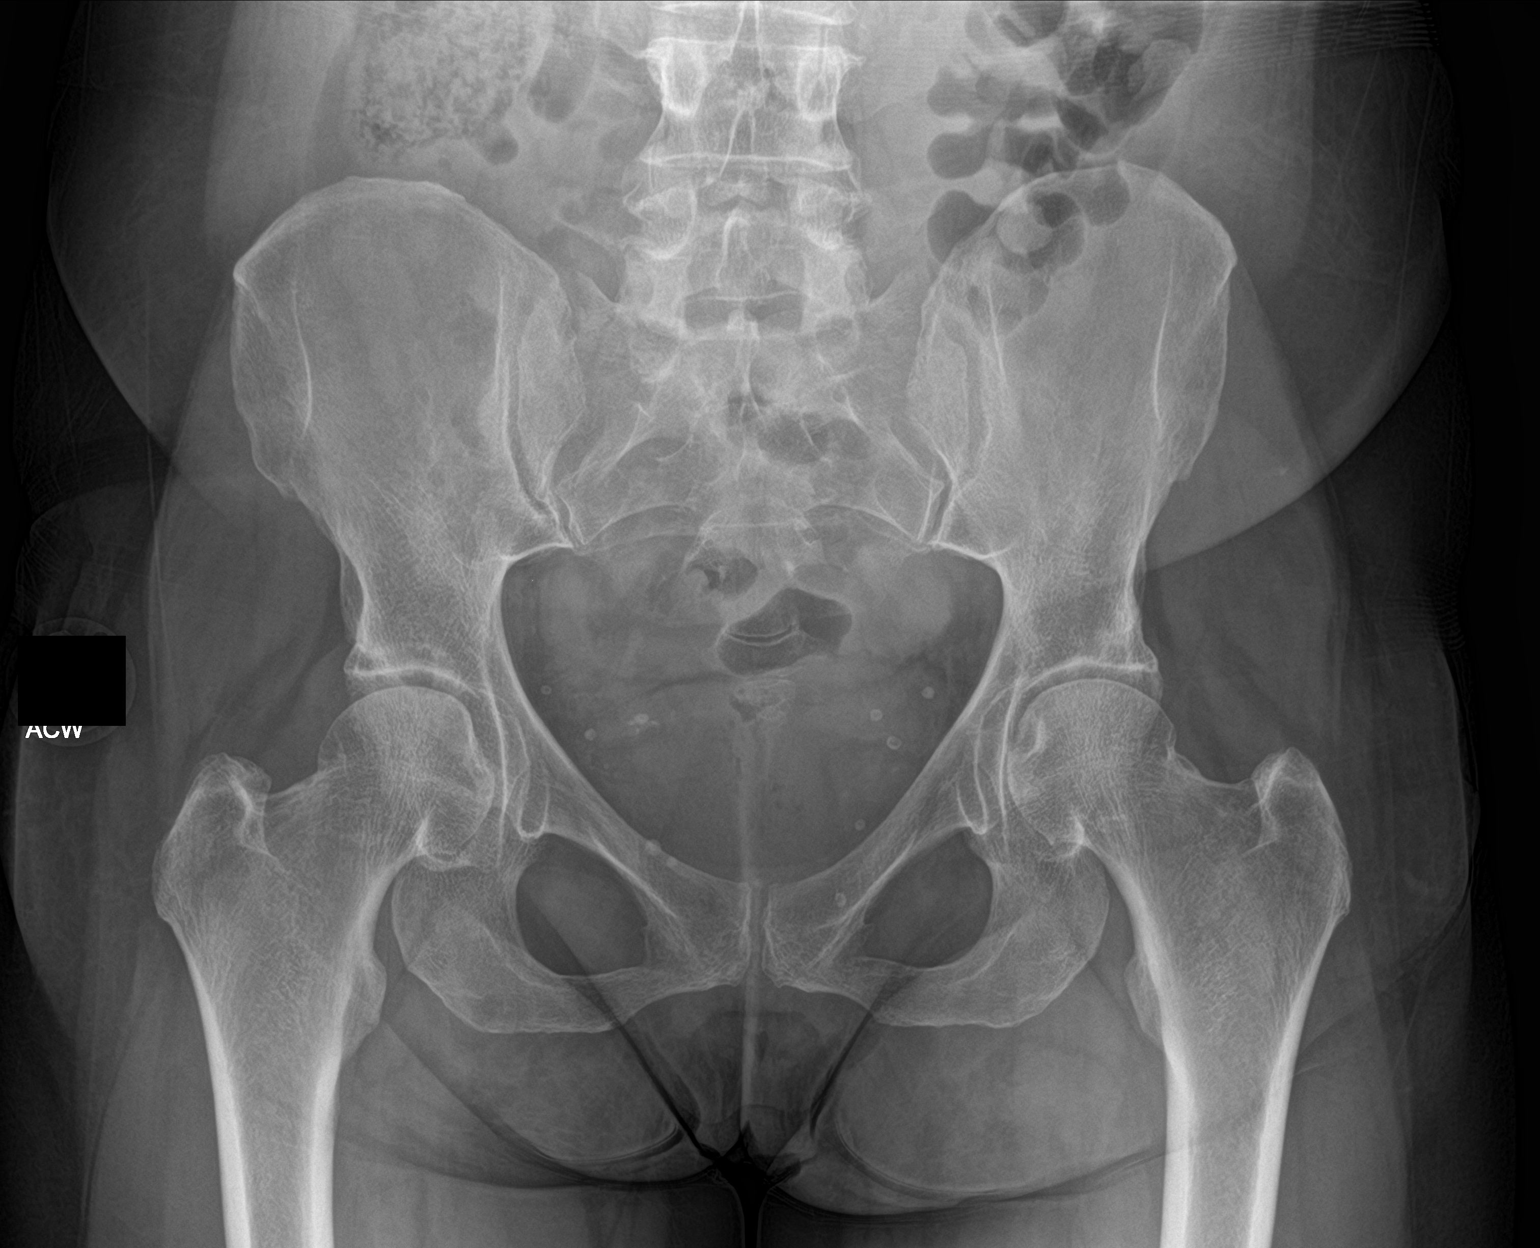

[3 of 3 positions shown; findings below may reference images not displayed]

FINDINGS: Pelvic ring is intact. No acute fracture or dislocation is noted. No
soft tissue abnormality is seen.
IMPRESSION: No acute abnormality noted

## 2021-03-17 ENCOUNTER — Ambulatory Visit (LOCAL_COMMUNITY_HEALTH_CENTER): Payer: Self-pay

## 2021-03-17 ENCOUNTER — Other Ambulatory Visit: Payer: Self-pay

## 2021-03-17 DIAGNOSIS — Z111 Encounter for screening for respiratory tuberculosis: Secondary | ICD-10-CM

## 2021-03-20 ENCOUNTER — Ambulatory Visit (LOCAL_COMMUNITY_HEALTH_CENTER): Payer: Self-pay

## 2021-03-20 ENCOUNTER — Other Ambulatory Visit: Payer: Self-pay

## 2021-03-20 DIAGNOSIS — Z111 Encounter for screening for respiratory tuberculosis: Secondary | ICD-10-CM

## 2021-03-20 LAB — TB SKIN TEST
Induration: 0 mm
TB Skin Test: NEGATIVE

## 2021-10-11 ENCOUNTER — Other Ambulatory Visit: Payer: Self-pay

## 2021-10-11 ENCOUNTER — Emergency Department
Admission: EM | Admit: 2021-10-11 | Discharge: 2021-10-11 | Disposition: A | Discharge status: Home / Self Care | Payer: Self-pay | Attending: Emergency Medicine | Admitting: Emergency Medicine

## 2021-10-11 ENCOUNTER — Emergency Department: Payer: Self-pay

## 2021-10-11 ENCOUNTER — Encounter: Payer: Self-pay | Admitting: Emergency Medicine

## 2021-10-11 DIAGNOSIS — S40011A Contusion of right shoulder, initial encounter: Secondary | ICD-10-CM | POA: Insufficient documentation

## 2021-10-11 DIAGNOSIS — W1839XA Other fall on same level, initial encounter: Secondary | ICD-10-CM | POA: Insufficient documentation

## 2021-10-11 MED ORDER — NAPROXEN 500 MG PO TABS: 500.0000 mg | ORAL_TABLET | Freq: Two times a day (BID) | ORAL | 0 refills | Status: DC

## 2021-10-11 NOTE — Discharge Instructions (Addendum)
Follow-up with Dr. Joice Lofts if any continued problems with your shoulder or not improving.  A prescription for naproxen 500 mg twice daily was sent to the pharmacy to begin taking today.  Ice and elevation as needed for pain.  You may also follow-up with your primary care provider if needed.  Additional pain medication as needed you may take Tylenol with this medicine without any difficulties.

## 2021-10-11 NOTE — ED Notes (Signed)
See triage note   States she fell yesterday  Landed on right shoulder

## 2021-10-11 NOTE — ED Notes (Signed)
Pt. Still in rad.

## 2021-10-11 NOTE — ED Triage Notes (Signed)
Pt to ED via POV stating that she fell yesterday and hurt her right shoulder. Pt has decreased ROM due to injury and pain. Pt denies hitting her head. Pt is currently in NAD.
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
## 2021-10-20 IMAGING — CR DG CHEST 2V
2 series · 2 of 2 positions shown · non-contrast
Comparison: Chest x-ray 12/12/2009.

CLINICAL DATA: 59-year-old female with history of chest pain
radiating into the right arm for the past 2 weeks.

EXAM:
CHEST - 2 VIEW

[chest pa]
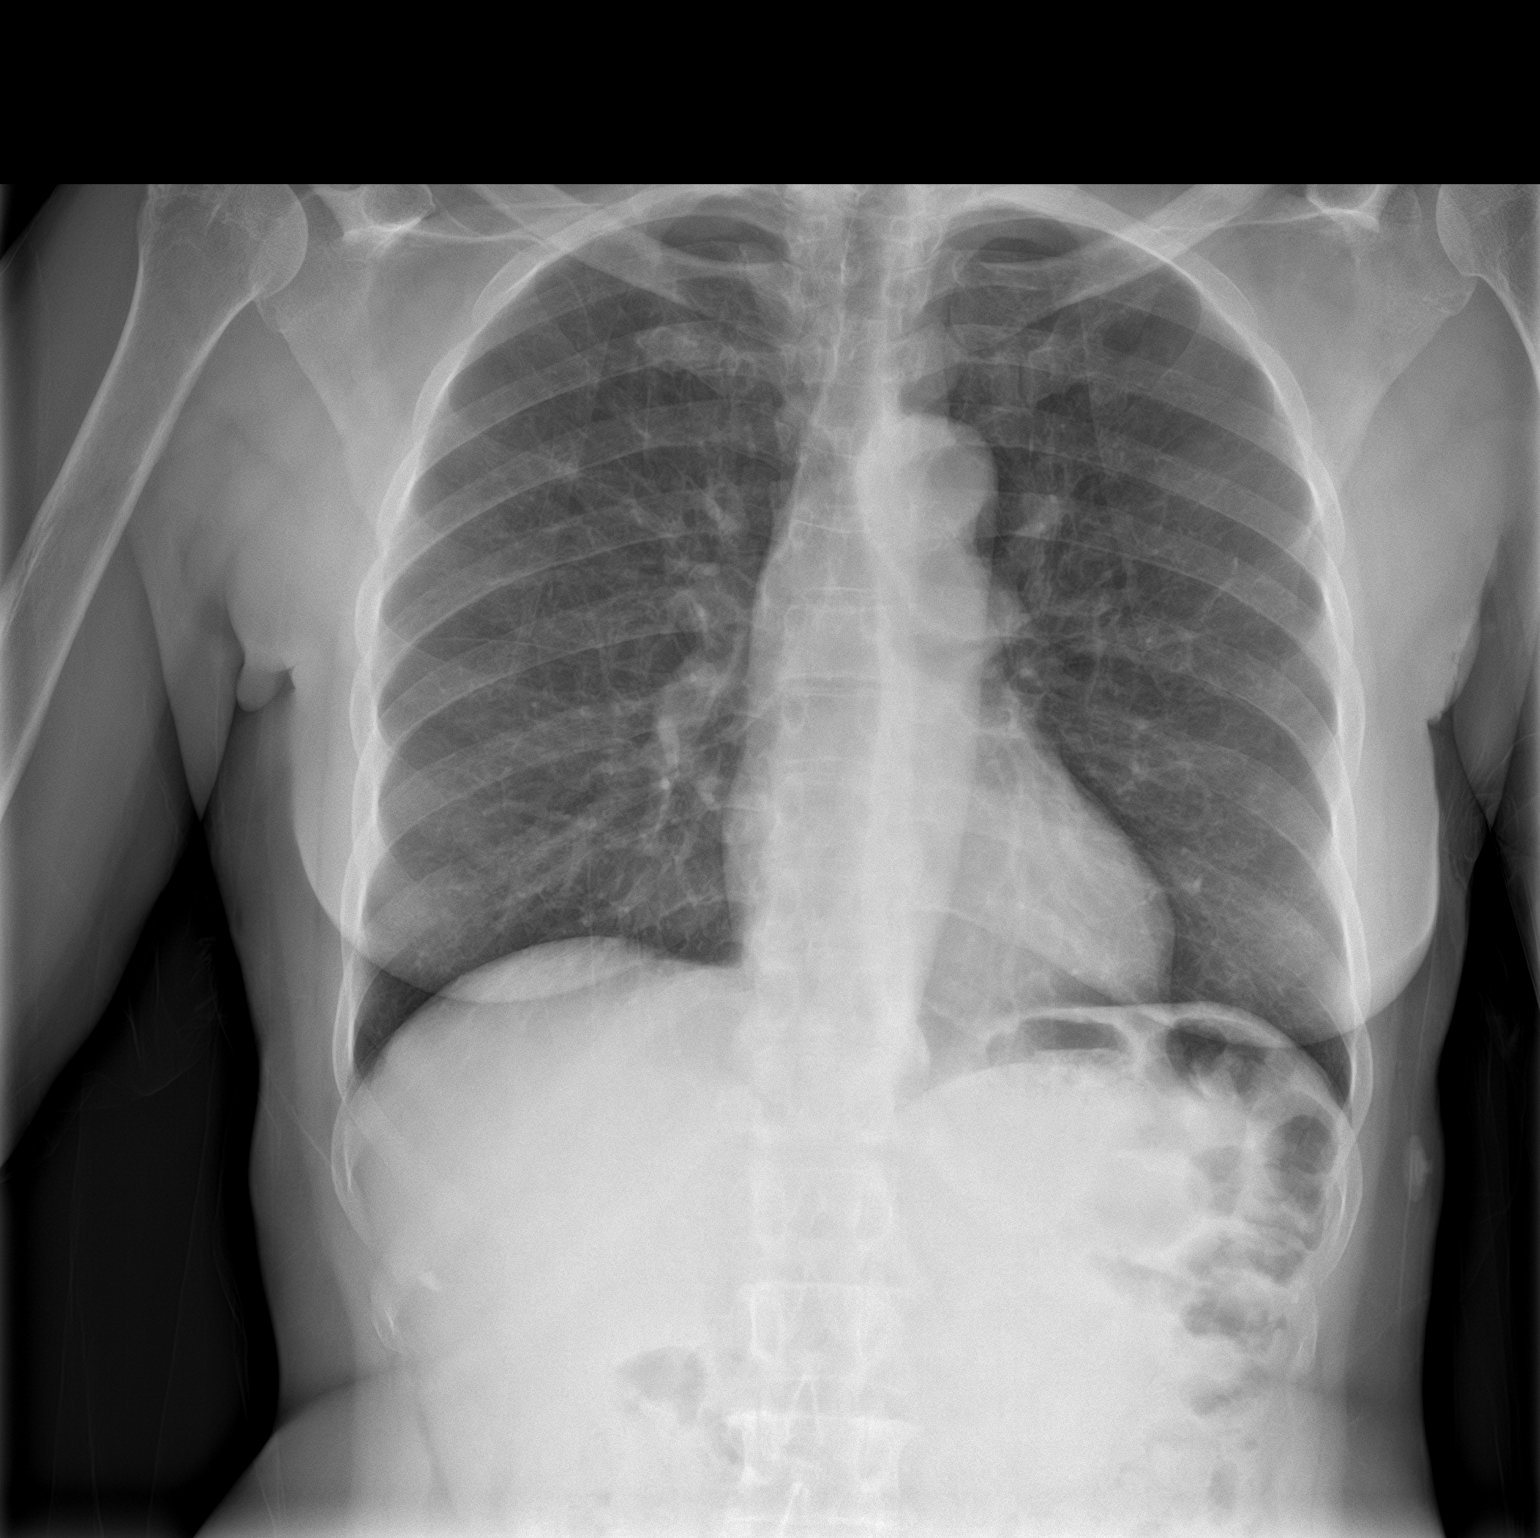

[chest lat]
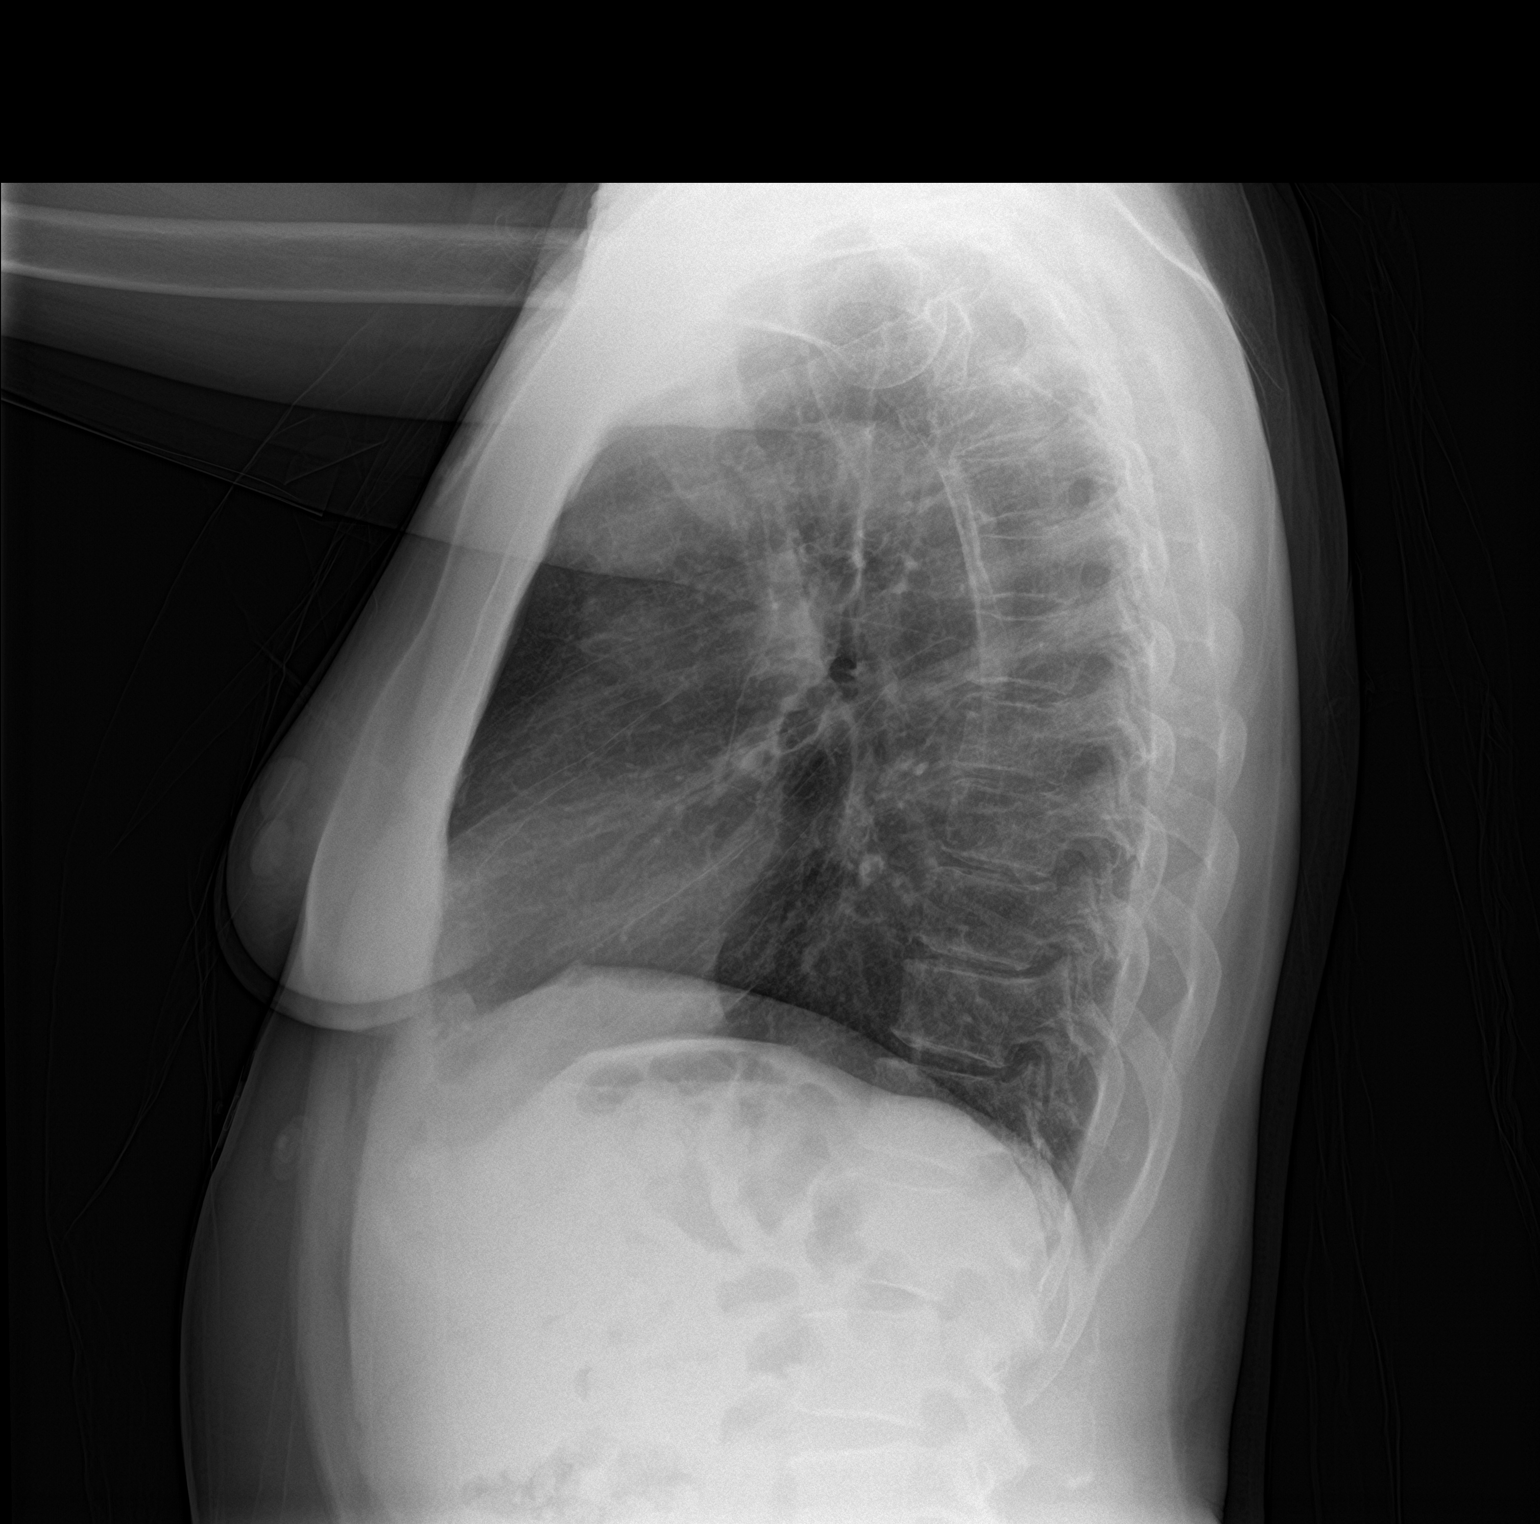

[2 of 2 positions shown; findings below may reference images not displayed]

FINDINGS: Lung volumes are normal. No consolidative airspace disease. No
pleural effusions. No pneumothorax. No pulmonary nodule or mass
noted. Pulmonary vasculature and the cardiomediastinal silhouette
are within normal limits.
IMPRESSION: No radiographic evidence of acute cardiopulmonary disease.

## 2021-12-04 ENCOUNTER — Other Ambulatory Visit: Payer: Self-pay

## 2021-12-04 ENCOUNTER — Emergency Department: Payer: Medicaid Other

## 2021-12-04 ENCOUNTER — Emergency Department
Admission: EM | Admit: 2021-12-04 | Discharge: 2021-12-04 | Disposition: A | Discharge status: Home / Self Care | Payer: Medicaid Other | Attending: Emergency Medicine | Admitting: Emergency Medicine

## 2021-12-04 DIAGNOSIS — F1490 Cocaine use, unspecified, uncomplicated: Secondary | ICD-10-CM | POA: Insufficient documentation

## 2021-12-04 DIAGNOSIS — R079 Chest pain, unspecified: Secondary | ICD-10-CM | POA: Insufficient documentation

## 2021-12-04 LAB — CBC WITH DIFFERENTIAL/PLATELET
Abs Immature Granulocytes: 0.01 10*3/uL (ref 0.00–0.07)
Basophils Absolute: 0 10*3/uL (ref 0.0–0.1)
Basophils Relative: 1 %
Eosinophils Absolute: 0.2 10*3/uL (ref 0.0–0.5)
Eosinophils Relative: 6 %
HCT: 38.2 % (ref 36.0–46.0)
Hemoglobin: 12.2 g/dL (ref 12.0–15.0)
Immature Granulocytes: 0 %
Lymphocytes Relative: 35 %
Lymphs Abs: 1.5 10*3/uL (ref 0.7–4.0)
MCH: 28.4 pg (ref 26.0–34.0)
MCHC: 31.9 g/dL (ref 30.0–36.0)
MCV: 89 fL (ref 80.0–100.0)
Monocytes Absolute: 0.3 10*3/uL (ref 0.1–1.0)
Monocytes Relative: 8 %
Neutro Abs: 2.1 10*3/uL (ref 1.7–7.7)
Neutrophils Relative %: 50 %
Platelets: 303 10*3/uL (ref 150–400)
RBC: 4.29 MIL/uL (ref 3.87–5.11)
RDW: 14.4 % (ref 11.5–15.5)
WBC: 4.2 10*3/uL (ref 4.0–10.5)
nRBC: 0 % (ref 0.0–0.2)

## 2021-12-04 LAB — COMPREHENSIVE METABOLIC PANEL
ALT: 24 U/L (ref 0–44)
AST: 20 U/L (ref 15–41)
Albumin: 4 g/dL (ref 3.5–5.0)
Alkaline Phosphatase: 56 U/L (ref 38–126)
Anion gap: 8 (ref 5–15)
BUN: 12 mg/dL (ref 6–20)
CO2: 26 mmol/L (ref 22–32)
Calcium: 9.3 mg/dL (ref 8.9–10.3)
Chloride: 108 mmol/L (ref 98–111)
Creatinine, Ser: 0.6 mg/dL (ref 0.44–1.00)
GFR, Estimated: >60 mL/min (ref >60)
Glucose, Bld: 93 mg/dL (ref 70–99)
Potassium: 3.8 mmol/L (ref 3.5–5.1)
Sodium: 142 mmol/L (ref 135–145)
Total Bilirubin: 0.8 mg/dL (ref 0.3–1.2)
Total Protein: 7.5 g/dL (ref 6.5–8.1)

## 2021-12-04 LAB — TROPONIN I (HIGH SENSITIVITY)
Troponin I (High Sensitivity): 2 ng/L (ref <18)
Troponin I (High Sensitivity): 3 ng/L (ref <18)

## 2021-12-04 LAB — D-DIMER, QUANTITATIVE: D-Dimer, Quant: 0.48 ug/mL-FEU (ref 0.00–0.50)

## 2021-12-04 MED ORDER — OXYCODONE-ACETAMINOPHEN 5-325 MG PO TABS
1.0000 | ORAL_TABLET | Freq: Once | ORAL | Status: AC
  Administered 2021-12-04: 1 via ORAL
  Filled 2021-12-04: qty 1

## 2021-12-04 NOTE — ED Provider Triage Note (Signed)
Emergency Medicine Provider Triage Evaluation Note  Courtney Ball , a 61 y.o. female  was evaluated in triage.  Pt complains of chest and back pain. Pain started 3 to 4 days ago in the back and now radiates into chest. Radiates more into R chest wall/shoulder.  Review of Systems  Positive: Back pain, chest pain Negative: Shortness of breath, GI symptoms, cough, palpitations  Physical Exam  BP (!) 143/81 (BP Location: Left Arm)   Pulse 64   Temp 98 F (36.7 C) (Oral)   Resp 18   Ht 5\' 4"  (1.626 m)   Wt 58.5 kg   LMP 09/22/2014 (Approximate)   SpO2 98%   BMI 22.14 kg/m  Gen:   Awake, no distress   Resp:  Normal effort  MSK:   Moves extremities without difficulty  Other:    Medical Decision Making  Medically screening exam initiated at 6:22 PM.  Appropriate orders placed.  Courtney Ball was informed that the remainder of the evaluation will be completed by another provider, this initial triage assessment does not replace that evaluation, and the importance of remaining in the ED until their evaluation is complete.  Labs, ekg, chest xray   Courtney Ball 12/04/21 Courtney Ball

## 2021-12-04 NOTE — ED Provider Notes (Signed)
[facility] Provider Note    Event Date/Time   First MD Initiated Contact with Patient 12/04/21 1928     (approximate)   History   Chief Complaint Back Pain (Pt presents with pain between her shoulder blades and radiating to her chest since Friday. Pt denies injury or cardiac hx. Of not, pt reports using cocaine a day ago. )   HPI  Courtney Ball is a 61 y.o. female with past medical history of cocaine use who presents to the ED complaining of chest pain.  Patient reports approximately 3 days of constant pain in the center of her chest radiating towards the middle of her back.  She describes the pain as sharp and not exacerbated or alleviated by anything in particular.  She denies any associated fevers, cough, or difficulty breathing.  She does not recall any heavy lifting or injuries to her chest or back.  She has not noticed any pain or swelling in her legs.  Patient does admit to cocaine use as recently as yesterday.     Physical Exam   Triage Vital Signs: ED Triage Vitals  Enc Vitals Group     BP 12/04/21 1812 (!) 143/81     Pulse Rate 12/04/21 1812 64     Resp 12/04/21 1812 18     Temp 12/04/21 1812 98 F (36.7 C)     Temp Source 12/04/21 1812 Oral     SpO2 12/04/21 1812 98 %     Weight 12/04/21 1813 128 lb 15.5 oz (58.5 kg)     Height 12/04/21 1813 5\' 4"  (1.626 m)     Head Circumference --      Peak Flow --      Pain Score 12/04/21 1813 10     Pain Loc --      Pain Edu? --      Excl. in [place]? --     Most recent vital signs: Vitals:   12/04/21 1812 12/04/21 2014  BP: (!) 143/81 (!) 153/77  Pulse: 64 62  Resp: 18 20  Temp: 98 F (36.7 C) 97.9 F (36.6 C)  SpO2: 98% 96%    Constitutional: Alert and oriented. Eyes: Conjunctivae are normal. Head: Atraumatic. Nose: No congestion/rhinnorhea. Mouth/Throat: Mucous membranes are moist.  Cardiovascular: Normal rate, regular rhythm. Grossly normal heart sounds.  2+ radial pulses  bilaterally. Respiratory: Normal respiratory effort.  No retractions. Lungs CTAB.  No chest wall tenderness to palpation. Gastrointestinal: Soft and nontender. No distention. Musculoskeletal: No lower extremity tenderness nor edema.  Neurologic:  Normal speech and language. No gross focal neurologic deficits are appreciated.    ED Results / Procedures / Treatments   Labs (all labs ordered are listed, but only abnormal results are displayed) Labs Reviewed  COMPREHENSIVE METABOLIC PANEL  CBC WITH DIFFERENTIAL/PLATELET  D-DIMER, QUANTITATIVE  TROPONIN I (HIGH SENSITIVITY)  TROPONIN I (HIGH SENSITIVITY)     EKG  ED ECG REPORT I, Blake Divine, the attending physician, personally viewed and interpreted this ECG.   Date: 12/04/2021  EKG Time: 18:18  Rate: 59  Rhythm: sinus bradycardia  Axis: Normal  Intervals:none  ST&T Change: None  RADIOLOGY Chest x-ray reviewed and interpreted by me with no infiltrate, edema, or effusion.  PROCEDURES:  Critical Care performed: No  Procedures   MEDICATIONS ORDERED IN ED: Medications  oxyCODONE-acetaminophen (PERCOCET/ROXICET) 5-325 MG per tablet 1 tablet (1 tablet Oral Given 12/04/21 2027)     IMPRESSION / MDM / ASSESSMENT AND PLAN / ED  COURSE  I reviewed the triage vital signs and the nursing notes.                              61 y.o. female with past medical history of cocaine use who presents to the ED complaining of 3 days of constant pain in the center of her chest radiating towards her back.  Patient's presentation is most consistent with acute presentation with potential threat to life or bodily function.  Differential diagnosis includes, but is not limited to, ACS, PE, dissection, pneumonia, pneumothorax, musculoskeletal pain, GERD, anxiety, cocaine associated chest pain.  Patient nontoxic-appearing and in no acute distress, vital signs are unremarkable.  EKG shows no evidence of arrhythmia or ischemia and troponin is  negative, doubt ACS given her atypical symptoms.  D-dimer performed and within normal limits, doubt PE or dissection given patient is low risk by [place].  Chest x-ray is unremarkable and remainder of labs are reassuring with no significant anemia, leukocytosis, electrolyte abnormality, or AKI.  Cocaine use may be contributing to patient's chest pain but given reassuring work-up, she is appropriate for discharge home with PCP follow-up.  She was counseled to return to the ED for new or worsening symptoms, patient agrees with plan.      FINAL CLINICAL IMPRESSION(S) / ED DIAGNOSES   Final diagnoses:  Chest pain, unspecified type  Cocaine use     Rx / DC Orders   ED Discharge Orders     None        Note:  This document was prepared using Dragon voice recognition software and may include unintentional dictation errors.   Blake Divine, MD 12/04/21 2203

## 2022-03-15 ENCOUNTER — Ambulatory Visit: Payer: Medicaid Other | Admitting: Nurse Practitioner

## 2022-03-15 ENCOUNTER — Encounter: Payer: Self-pay | Admitting: Nurse Practitioner

## 2022-03-15 DIAGNOSIS — R011 Cardiac murmur, unspecified: Secondary | ICD-10-CM | POA: Insufficient documentation

## 2022-03-15 DIAGNOSIS — Z113 Encounter for screening for infections with a predominantly sexual mode of transmission: Secondary | ICD-10-CM | POA: Diagnosis not present

## 2022-03-15 DIAGNOSIS — I38 Endocarditis, valve unspecified: Secondary | ICD-10-CM | POA: Insufficient documentation

## 2022-03-15 DIAGNOSIS — A599 Trichomoniasis, unspecified: Secondary | ICD-10-CM

## 2022-03-15 DIAGNOSIS — F339 Major depressive disorder, recurrent, unspecified: Secondary | ICD-10-CM

## 2022-03-15 DIAGNOSIS — R001 Bradycardia, unspecified: Secondary | ICD-10-CM | POA: Insufficient documentation

## 2022-03-15 LAB — HM HIV SCREENING LAB: HM HIV Screening: NEGATIVE

## 2022-03-15 LAB — WET PREP FOR TRICH, YEAST, CLUE
Trichomonas Exam: POSITIVE — AB
Yeast Exam: NEGATIVE

## 2022-03-15 LAB — HM HEPATITIS C SCREENING LAB: HM Hepatitis Screen: NEGATIVE

## 2022-03-15 MED ORDER — METRONIDAZOLE 500 MG PO TABS: 500.0000 mg | ORAL_TABLET | Freq: Two times a day (BID) | ORAL | 0 refills | Status: AC

## 2022-03-15 NOTE — Progress Notes (Signed)
Pt is here for STD screening.  Wet mount results reviewed.  The patient was dispensed Metronidazole 500 mg #14 today. I provided counseling today regarding the medication. We discussed the medication, the side effects and when to call clinic. Patient given the opportunity to ask questions. Questions answered.   Condoms declined.  Courtney Ball M Courtney Shirah, RN    
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
## 2022-04-02 DIAGNOSIS — Z1159 Encounter for screening for other viral diseases: Secondary | ICD-10-CM | POA: Diagnosis not present

## 2022-04-02 DIAGNOSIS — Z113 Encounter for screening for infections with a predominantly sexual mode of transmission: Secondary | ICD-10-CM | POA: Diagnosis not present

## 2022-04-02 DIAGNOSIS — Z1389 Encounter for screening for other disorder: Secondary | ICD-10-CM | POA: Diagnosis not present

## 2022-04-02 DIAGNOSIS — Z0000 Encounter for general adult medical examination without abnormal findings: Secondary | ICD-10-CM | POA: Diagnosis not present

## 2022-04-02 DIAGNOSIS — Z131 Encounter for screening for diabetes mellitus: Secondary | ICD-10-CM | POA: Diagnosis not present

## 2022-04-02 DIAGNOSIS — K921 Melena: Secondary | ICD-10-CM | POA: Diagnosis not present

## 2022-04-02 DIAGNOSIS — Z719 Counseling, unspecified: Secondary | ICD-10-CM | POA: Diagnosis not present

## 2022-04-02 DIAGNOSIS — R634 Abnormal weight loss: Secondary | ICD-10-CM | POA: Diagnosis not present

## 2022-04-03 DIAGNOSIS — H25812 Combined forms of age-related cataract, left eye: Secondary | ICD-10-CM | POA: Diagnosis not present

## 2022-04-03 DIAGNOSIS — H25811 Combined forms of age-related cataract, right eye: Secondary | ICD-10-CM | POA: Diagnosis not present

## 2022-04-05 ENCOUNTER — Other Ambulatory Visit: Payer: Self-pay | Admitting: Family Medicine

## 2022-04-05 DIAGNOSIS — Z1231 Encounter for screening mammogram for malignant neoplasm of breast: Secondary | ICD-10-CM

## 2022-04-16 DIAGNOSIS — F331 Major depressive disorder, recurrent, moderate: Secondary | ICD-10-CM | POA: Diagnosis not present

## 2022-04-25 ENCOUNTER — Ambulatory Visit
Admission: RE | Admit: 2022-04-25 | Discharge: 2022-04-25 | Disposition: A | Discharge status: Home / Self Care | Payer: Medicaid Other | Source: Ambulatory Visit | Attending: Family Medicine | Admitting: Family Medicine

## 2022-04-25 DIAGNOSIS — Z1231 Encounter for screening mammogram for malignant neoplasm of breast: Secondary | ICD-10-CM | POA: Insufficient documentation

## 2022-04-30 ENCOUNTER — Other Ambulatory Visit: Payer: Self-pay | Admitting: Family Medicine

## 2022-04-30 DIAGNOSIS — N6489 Other specified disorders of breast: Secondary | ICD-10-CM

## 2022-04-30 DIAGNOSIS — R928 Other abnormal and inconclusive findings on diagnostic imaging of breast: Secondary | ICD-10-CM

## 2022-05-03 DIAGNOSIS — H25812 Combined forms of age-related cataract, left eye: Secondary | ICD-10-CM | POA: Diagnosis not present

## 2022-05-03 DIAGNOSIS — H2512 Age-related nuclear cataract, left eye: Secondary | ICD-10-CM | POA: Diagnosis not present

## 2022-05-03 DIAGNOSIS — H269 Unspecified cataract: Secondary | ICD-10-CM | POA: Diagnosis not present

## 2022-05-14 DIAGNOSIS — Z113 Encounter for screening for infections with a predominantly sexual mode of transmission: Secondary | ICD-10-CM | POA: Diagnosis not present

## 2022-05-14 DIAGNOSIS — Z1389 Encounter for screening for other disorder: Secondary | ICD-10-CM | POA: Diagnosis not present

## 2022-05-14 DIAGNOSIS — Z124 Encounter for screening for malignant neoplasm of cervix: Secondary | ICD-10-CM | POA: Diagnosis not present

## 2022-05-14 DIAGNOSIS — J069 Acute upper respiratory infection, unspecified: Secondary | ICD-10-CM | POA: Diagnosis not present

## 2022-05-14 DIAGNOSIS — F331 Major depressive disorder, recurrent, moderate: Secondary | ICD-10-CM | POA: Diagnosis not present

## 2022-05-14 DIAGNOSIS — R634 Abnormal weight loss: Secondary | ICD-10-CM | POA: Diagnosis not present

## 2022-05-16 ENCOUNTER — Ambulatory Visit
Admission: RE | Admit: 2022-05-16 | Discharge: 2022-05-16 | Disposition: A | Discharge status: Home / Self Care | Payer: Medicaid Other | Source: Ambulatory Visit | Attending: Family Medicine | Admitting: Family Medicine

## 2022-05-16 DIAGNOSIS — R928 Other abnormal and inconclusive findings on diagnostic imaging of breast: Secondary | ICD-10-CM | POA: Insufficient documentation

## 2022-05-16 DIAGNOSIS — N6489 Other specified disorders of breast: Secondary | ICD-10-CM | POA: Insufficient documentation

## 2022-05-24 DIAGNOSIS — H25811 Combined forms of age-related cataract, right eye: Secondary | ICD-10-CM | POA: Diagnosis not present

## 2022-05-24 DIAGNOSIS — H269 Unspecified cataract: Secondary | ICD-10-CM | POA: Diagnosis not present

## 2022-06-16 ENCOUNTER — Other Ambulatory Visit: Payer: Self-pay

## 2022-06-16 ENCOUNTER — Emergency Department: Payer: Medicaid Other

## 2022-06-16 ENCOUNTER — Emergency Department
Admission: EM | Admit: 2022-06-16 | Discharge: 2022-06-17 | Disposition: A | Discharge status: Home / Self Care | Payer: Medicaid Other | Attending: Emergency Medicine | Admitting: Emergency Medicine

## 2022-06-16 DIAGNOSIS — K5190 Ulcerative colitis, unspecified, without complications: Secondary | ICD-10-CM | POA: Diagnosis not present

## 2022-06-16 DIAGNOSIS — R109 Unspecified abdominal pain: Secondary | ICD-10-CM | POA: Diagnosis present

## 2022-06-16 DIAGNOSIS — K921 Melena: Secondary | ICD-10-CM | POA: Diagnosis not present

## 2022-06-16 DIAGNOSIS — K51911 Ulcerative colitis, unspecified with rectal bleeding: Secondary | ICD-10-CM | POA: Diagnosis not present

## 2022-06-16 LAB — COMPREHENSIVE METABOLIC PANEL
ALT: 13 U/L (ref 0–44)
AST: 16 U/L (ref 15–41)
Albumin: 3.8 g/dL (ref 3.5–5.0)
Alkaline Phosphatase: 57 U/L (ref 38–126)
Anion gap: 5 (ref 5–15)
BUN: 20 mg/dL (ref 6–20)
CO2: 25 mmol/L (ref 22–32)
Calcium: 8.9 mg/dL (ref 8.9–10.3)
Chloride: 104 mmol/L (ref 98–111)
Creatinine, Ser: 0.74 mg/dL (ref 0.44–1.00)
GFR, Estimated: >60 mL/min (ref >60)
Glucose, Bld: 87 mg/dL (ref 70–99)
Potassium: 3.4 mmol/L — ABNORMAL LOW (ref 3.5–5.1)
Sodium: 134 mmol/L — ABNORMAL LOW (ref 135–145)
Total Bilirubin: 0.5 mg/dL (ref 0.3–1.2)
Total Protein: 7.4 g/dL (ref 6.5–8.1)

## 2022-06-16 LAB — CBC
HCT: 40.7 % (ref 36.0–46.0)
Hemoglobin: 12.7 g/dL (ref 12.0–15.0)
MCH: 28.2 pg (ref 26.0–34.0)
MCHC: 31.2 g/dL (ref 30.0–36.0)
MCV: 90.4 fL (ref 80.0–100.0)
Platelets: 346 10*3/uL (ref 150–400)
RBC: 4.5 MIL/uL (ref 3.87–5.11)
RDW: 13.9 % (ref 11.5–15.5)
WBC: 6.8 10*3/uL (ref 4.0–10.5)
nRBC: 0 % (ref 0.0–0.2)

## 2022-06-16 LAB — TYPE AND SCREEN
ABO/RH(D): A POS
Antibody Screen: NEGATIVE

## 2022-06-16 MED ORDER — IOHEXOL 300 MG/ML  SOLN
100.0000 mL | Freq: Once | INTRAMUSCULAR | Status: AC | PRN
  Administered 2022-06-16: 100 mL via INTRAVENOUS

## 2022-06-16 MED ORDER — SODIUM CHLORIDE 0.9 % IV BOLUS
1000.0000 mL | Freq: Once | INTRAVENOUS | Status: AC
  Administered 2022-06-16: 1000 mL via INTRAVENOUS

## 2022-06-16 NOTE — ED Provider Notes (Signed)
[facility] Emergency Department Provider Note   ____________________________________________   Event Date/Time   First MD Initiated Contact with Patient 06/16/22 2158     (approximate)  I have reviewed the triage vital signs and the nursing notes.   HISTORY  Chief Complaint Abdominal Pain    HPI Courtney Ball is a 62 y.o. female presents to the emergency room for blood in stool.  Patient reports that for the last 3 months she has been having blood in her stool.  However, this is gotten worse over the last 3 days.  Patient reports that she is having 1-2 bowel movements a day and all bowel movements have noted blood.  Patient denies fever, nausea/vomiting/diarrhea/constipation, abdominal pain, back pain.  Patient denies fatigue, weakness, decreased appetite.  Patient reports that her main concern for being here tonight is that the bleeding seems to have gotten worse.    Past Medical History:  Diagnosis Date   Bradycardia    Carpal tunnel syndrome    Constipation    Drug abuse ([facility])    Heart murmur    Valvular heart disease     Patient Active Problem List   Diagnosis Date Noted   Bradycardia 03/15/2022   Heart murmur 03/15/2022   Valvular heart disease 03/15/2022   Postmenopausal 04/04/2020   Depression, dx'd 5-10 years ago 10/29/2019   Cocaine use  09/07/2019   Marijuana use qo day 09/07/2019   Drug abuse 02/18/2018   Constipation 06/17/2012   Carpal tunnel syndrome 12/29/2010    Past Surgical History:  Procedure Laterality Date   COLONOSCOPY     HERNIA REPAIR  2006   ventral    Prior to Admission medications   Medication Sig Start Date End Date Taking? Authorizing Provider  predniSONE (DELTASONE) 10 MG tablet Take 4 tabs daily x1 week then decrease by 1/2 tab daily until gone 06/17/22  Yes Suren Payne, Pearlie Oyster, NP  acetaminophen (TYLENOL) 325 MG tablet Take 650 mg by mouth every 6 (six) hours as needed for headache or moderate pain.     [provider]  Dextromethorphan-Guaifenesin 60-1200 MG 12hr tablet Take by mouth. Patient not taking: Reported on 04/04/2020 02/26/18   [provider]  linaCLOtide (LINZESS PO) Take by mouth 2 (two) times daily. Patient not taking: Reported on 04/04/2020    [provider]  naproxen (NAPROSYN) 500 MG tablet Take 1 tablet (500 mg total) by mouth 2 (two) times daily with a meal. 10/11/21   Bridget Hartshorn L, PA-C  polyethylene glycol powder (GLYCOLAX/MIRALAX) powder Take 1-2 capfuls (17-34g) daily.  Mix in 4-8ounces of fluid prior to taking. Patient not taking: Reported on 04/04/2020 07/02/16   [provider]    Allergies Sulfa antibiotics  Family History  Problem Relation Age of Onset   Breast cancer Neg Hx     Social History Social History   Tobacco Use   Smoking status: Never   Smokeless tobacco: Never  Vaping Use   Vaping Use: Never used  Substance Use Topics   Alcohol use: Not Currently    Alcohol/week: 0.0 standard drinks of alcohol    Comment: "3 years ago"   Drug use: Yes    Types: Cocaine, Marijuana    Comment: cocaine    Review of Systems  Constitutional: No fever/chills Eyes: No visual changes. Cardiovascular: Denies chest pain. Respiratory: Denies shortness of breath. Gastrointestinal: No abdominal pain.  No nausea, no vomiting.  No diarrhea.  No constipation.  Blood in stool  Genitourinary: Negative for dysuria. Musculoskeletal: Negative for back pain. Skin: Negative for rash. Neurological: Negative for headaches, focal weakness or numbness.   ____________________________________________   PHYSICAL EXAM:  VITAL SIGNS: ED Triage Vitals [06/16/22 1947]  Enc Vitals Group     BP 138/64     Pulse Rate 85     Resp 17     Temp 98.3 F (36.8 C)     Temp Source Oral     SpO2 100 %     Weight      Height      Head Circumference      Peak Flow      Pain Score 0     Pain Loc      Pain Edu?      Excl. in GC?      Constitutional: Alert and oriented. Well appearing and in no acute distress. Eyes: Conjunctivae are normal. PERRL. EOMI. Head: Atraumatic. Mouth/Throat: Mucous membranes are moist.  Neck: No stridor.   Cardiovascular: Normal rate, regular rhythm. Grossly normal heart sounds.  Good peripheral circulation. Respiratory: Normal respiratory effort.  No retractions. Lungs CTAB. Gastrointestinal: Soft and nontender. No distention. No abdominal bruits. No CVA tenderness.  No rebound tenderness or guarding.  Bowel sounds positive in all 4 quadrants. Neurologic:  Normal speech and language. No gross focal neurologic deficits are appreciated. No gait instability. Skin:  Skin is warm, dry and intact. No rash noted. Psychiatric: Mood and affect are normal. Speech and behavior are normal.  ____________________________________________   LABS (all labs ordered are listed, but only abnormal results are displayed)  Labs Reviewed  COMPREHENSIVE METABOLIC PANEL - Abnormal; Notable for the following components:      Result Value   Sodium 134 (*)    Potassium 3.4 (*)    All other components within normal limits  CBC  POC OCCULT BLOOD, ED  TYPE AND SCREEN   ____________________________________________  EKG   ____________________________________________  RADIOLOGY  ED MD interpretation: CT reviewed by me and read by radiologist.  Official radiology report(s): CT ABDOMEN PELVIS W CONTRAST  Result Date: 06/16/2022 CLINICAL DATA:  Bloody stool. EXAM: CT ABDOMEN AND PELVIS WITH CONTRAST TECHNIQUE: Multidetector CT imaging of the abdomen and pelvis was performed using the standard protocol following bolus administration of intravenous contrast. RADIATION DOSE REDUCTION: This exam was performed according to the departmental dose-optimization program which includes automated exposure control, adjustment of the mA and/or kV according to patient size and/or use of iterative reconstruction technique.  CONTRAST:  OMNIPAQUE IOHEXOL 300 MG/ML  SOLN COMPARISON:  None Available. FINDINGS: Lower chest: No acute abnormality. Hepatobiliary: No focal liver abnormality is seen. No gallstones, gallbladder wall thickening, or biliary dilatation. Pancreas: Unremarkable. No pancreatic ductal dilatation or surrounding inflammatory changes. Spleen: Normal in size without focal abnormality. Adrenals/Urinary Tract: The right adrenal gland is unremarkable. A 5.0 cm x 3.9 cm x 4.6 cm cyst is seen within the left adrenal gland (approximately 23.60 Hounsfield units). Kidneys are normal, without renal calculi, focal lesion, or hydronephrosis. Bladder is unremarkable. Stomach/Bowel: Stomach is within normal limits. Appendix appears normal. A large amount of stool is seen throughout the colon. No evidence of bowel dilatation. The mid sigmoid colon is markedly thickened and inflamed. Vascular/Lymphatic: No significant vascular findings are present. No enlarged abdominal or pelvic lymph nodes. Reproductive: Uterus and bilateral adnexa are unremarkable. Other: No abdominal wall hernia or abnormality. No abdominopelvic ascites. Musculoskeletal: No acute or significant osseous findings. IMPRESSION: 1. Marked severity colitis involving the mid  sigmoid colon. 2. Large left adrenal cyst. No follow-up imaging is recommended. This recommendation follows ACR consensus guidelines: Management of Incidental Adrenal Masses: A White Paper of the ACR Incidental Findings Committee. J Am Coll Radiol 2017;14:1038-1044. Electronically Signed   By: Aram Candela M.D.   On: 06/16/2022 23:30    ____________________________________________   PROCEDURES  Procedure(s) performed: None  Procedures  Critical Care performed: No  ____________________________________________   INITIAL IMPRESSION / ASSESSMENT AND PLAN / ED COURSE     Courtney Ball is a 62 y.o. female presents to the emergency room for blood in stool.  Patient reports  that for the last 3 months she has been having blood in her stool.  However, this is gotten worse over the last 3 days.  Patient reports that she is having 1-2 bowel movements a day and all bowel movements have noted blood.  Patient denies fever, nausea/vomiting/diarrhea/constipation, abdominal pain, back pain.  Patient denies fatigue, weakness, decreased appetite.  Patient reports that her main concern for being here tonight is that the bleeding seems to have gotten worse.  CBC and CMP obtained in triage CT of abdomen and pelvis ordered.  CBC and CMP are unremarkable/reassuring.  Patient has normal hemoglobin of 12.7. CT shows 1. Marked severity colitis involving the mid sigmoid colon.  2. Large left adrenal cyst. No follow-up imaging is recommended.   After discussion with patient about diagnosis of ulcerative colitis, she does have significant amount of financial burden and that needs to be considered when prescribing medication for treatment.  Therefore, based on up-to-date recommended guidelines I will treat her with prednisone 40 mg daily for 1 week and then taper down by 5 mg daily until gone.  Patient has strict return precautions given for return to the emergency room.  She should follow-up with her primary care provider within the next 10 to 14 days to discuss effectiveness of the medication and next steps/GI referral if needed.  Patient will be discharged home in stable condition at this time.      ____________________________________________   FINAL CLINICAL IMPRESSION(S) / ED DIAGNOSES  Final diagnoses:  Ulcerative colitis with rectal bleeding, unspecified location     ED Discharge Orders          Ordered    predniSONE (DELTASONE) 10 MG tablet        06/17/22 0018             Note:  This document was prepared using Dragon voice recognition software and may include unintentional dictation errors.     Herschell Dimes, NP 06/17/22 Jacinta Shoe    Phineas Semen,  MD 06/17/22 [phone]

## 2022-06-16 NOTE — ED Triage Notes (Signed)
Pt c/o frank blood in stools that started 3 days ago. Pt c/o weakness. Pt denies CP, N/V/D. Pt denies blood thinners. Pt AOX4, Nad noted.

## 2022-06-17 MED ORDER — PREDNISONE 10 MG PO TABS: ORAL_TABLET | ORAL | 0 refills | Status: DC

## 2022-06-17 MED ORDER — PREDNISONE 20 MG PO TABS
40.0000 mg | ORAL_TABLET | Freq: Once | ORAL | Status: AC
  Administered 2022-06-17: 40 mg via ORAL
  Filled 2022-06-17: qty 2

## 2022-06-17 NOTE — Discharge Instructions (Signed)
You have been seen today in the emergency room and diagnosed with ulcerative colitis.  We have discussed you should return to the emergency room or urgent care promptly if you have significant amount of increase in rectal bleeding, severe abdominal cramping, fevers.  You have been given a medication today called prednisone that you will take as we have discussed starting with 40 mg daily x 1 week and then decreasing by 5 mg a day until gone.  You should schedule a follow-up appointment with your primary care provider within 7 to 10 days to discuss treatment effectiveness and next course of action.

## 2022-06-30 DIAGNOSIS — R109 Unspecified abdominal pain: Secondary | ICD-10-CM | POA: Diagnosis not present

## 2022-06-30 DIAGNOSIS — Z882 Allergy status to sulfonamides status: Secondary | ICD-10-CM | POA: Diagnosis not present

## 2022-06-30 DIAGNOSIS — K625 Hemorrhage of anus and rectum: Secondary | ICD-10-CM | POA: Diagnosis not present

## 2022-07-20 DIAGNOSIS — R933 Abnormal findings on diagnostic imaging of other parts of digestive tract: Secondary | ICD-10-CM | POA: Diagnosis not present

## 2022-07-20 DIAGNOSIS — F1410 Cocaine abuse, uncomplicated: Secondary | ICD-10-CM | POA: Diagnosis not present

## 2022-07-20 DIAGNOSIS — K51511 Left sided colitis with rectal bleeding: Secondary | ICD-10-CM | POA: Diagnosis not present

## 2022-07-20 DIAGNOSIS — R197 Diarrhea, unspecified: Secondary | ICD-10-CM | POA: Diagnosis not present

## 2022-07-20 DIAGNOSIS — K921 Melena: Secondary | ICD-10-CM | POA: Diagnosis not present

## 2022-07-20 DIAGNOSIS — Z8719 Personal history of other diseases of the digestive system: Secondary | ICD-10-CM | POA: Diagnosis not present

## 2022-07-20 DIAGNOSIS — Z800 Family history of malignant neoplasm of digestive organs: Secondary | ICD-10-CM | POA: Diagnosis not present

## 2022-07-20 DIAGNOSIS — R634 Abnormal weight loss: Secondary | ICD-10-CM | POA: Diagnosis not present

## 2022-07-23 DIAGNOSIS — R197 Diarrhea, unspecified: Secondary | ICD-10-CM | POA: Diagnosis not present

## 2022-07-23 DIAGNOSIS — R933 Abnormal findings on diagnostic imaging of other parts of digestive tract: Secondary | ICD-10-CM | POA: Diagnosis not present

## 2022-07-23 DIAGNOSIS — K921 Melena: Secondary | ICD-10-CM | POA: Diagnosis not present

## 2022-07-31 ENCOUNTER — Encounter: Payer: Self-pay | Admitting: *Deleted

## 2022-07-31 ENCOUNTER — Ambulatory Visit: Payer: 59 | Admitting: Anesthesiology

## 2022-07-31 ENCOUNTER — Ambulatory Visit
Admission: RE | Admit: 2022-07-31 | Discharge: 2022-07-31 | Disposition: A | Discharge status: Home / Self Care | Payer: 59 | Attending: Gastroenterology | Admitting: Gastroenterology

## 2022-07-31 ENCOUNTER — Encounter
Admission: RE | Disposition: A | Discharge status: Home / Self Care | Payer: Self-pay | Source: Home / Self Care | Attending: Gastroenterology

## 2022-07-31 ENCOUNTER — Other Ambulatory Visit: Payer: Self-pay

## 2022-07-31 DIAGNOSIS — K6389 Other specified diseases of intestine: Secondary | ICD-10-CM | POA: Diagnosis not present

## 2022-07-31 DIAGNOSIS — D128 Benign neoplasm of rectum: Secondary | ICD-10-CM | POA: Insufficient documentation

## 2022-07-31 DIAGNOSIS — R634 Abnormal weight loss: Secondary | ICD-10-CM | POA: Diagnosis not present

## 2022-07-31 DIAGNOSIS — Z800 Family history of malignant neoplasm of digestive organs: Secondary | ICD-10-CM | POA: Insufficient documentation

## 2022-07-31 DIAGNOSIS — C187 Malignant neoplasm of sigmoid colon: Secondary | ICD-10-CM | POA: Diagnosis not present

## 2022-07-31 DIAGNOSIS — K2960 Other gastritis without bleeding: Secondary | ICD-10-CM | POA: Diagnosis not present

## 2022-07-31 DIAGNOSIS — K449 Diaphragmatic hernia without obstruction or gangrene: Secondary | ICD-10-CM | POA: Insufficient documentation

## 2022-07-31 DIAGNOSIS — K621 Rectal polyp: Secondary | ICD-10-CM | POA: Diagnosis not present

## 2022-07-31 DIAGNOSIS — Z681 Body mass index (BMI) 19 or less, adult: Secondary | ICD-10-CM | POA: Diagnosis not present

## 2022-07-31 DIAGNOSIS — K641 Second degree hemorrhoids: Secondary | ICD-10-CM | POA: Insufficient documentation

## 2022-07-31 DIAGNOSIS — R933 Abnormal findings on diagnostic imaging of other parts of digestive tract: Secondary | ICD-10-CM | POA: Diagnosis not present

## 2022-07-31 DIAGNOSIS — T7840XA Allergy, unspecified, initial encounter: Secondary | ICD-10-CM | POA: Diagnosis not present

## 2022-07-31 DIAGNOSIS — K2970 Gastritis, unspecified, without bleeding: Secondary | ICD-10-CM | POA: Diagnosis not present

## 2022-07-31 DIAGNOSIS — Z538 Procedure and treatment not carried out for other reasons: Secondary | ICD-10-CM | POA: Diagnosis not present

## 2022-07-31 HISTORY — PX: ESOPHAGOGASTRODUODENOSCOPY (EGD) WITH PROPOFOL: SHX5813

## 2022-07-31 HISTORY — PX: COLONOSCOPY WITH PROPOFOL: SHX5780

## 2022-07-31 SURGERY — COLONOSCOPY WITH PROPOFOL
Anesthesia: General

## 2022-07-31 MED ORDER — PROPOFOL 10 MG/ML IV BOLUS
INTRAVENOUS | Status: DC | PRN
  Administered 2022-07-31 (×2): 20 mg via INTRAVENOUS
  Administered 2022-07-31: 190 ug/kg/min via INTRAVENOUS
  Administered 2022-07-31: 20 mg via INTRAVENOUS
  Administered 2022-07-31: 100 mg via INTRAVENOUS
  Administered 2022-07-31: 20 mg via INTRAVENOUS

## 2022-07-31 MED ORDER — PHENYLEPHRINE 80 MCG/ML (10ML) SYRINGE FOR IV PUSH (FOR BLOOD PRESSURE SUPPORT)
PREFILLED_SYRINGE | INTRAVENOUS | Status: AC
  Filled 2022-07-31: qty 10

## 2022-07-31 MED ORDER — SODIUM CHLORIDE 0.9 % IV SOLN: INTRAVENOUS | Status: DC

## 2022-07-31 MED ORDER — PHENYLEPHRINE HCL (PRESSORS) 10 MG/ML IV SOLN
INTRAVENOUS | Status: DC | PRN
  Administered 2022-07-31 (×3): 80 ug via INTRAVENOUS

## 2022-07-31 MED ORDER — EPHEDRINE 5 MG/ML INJ
INTRAVENOUS | Status: AC
  Filled 2022-07-31: qty 5

## 2022-07-31 MED ORDER — EPHEDRINE SULFATE (PRESSORS) 50 MG/ML IJ SOLN
INTRAMUSCULAR | Status: DC | PRN
  Administered 2022-07-31: 10 mg via INTRAVENOUS

## 2022-07-31 MED ORDER — SPOT INK MARKER SYRINGE KIT
PACK | SUBMUCOSAL | Status: DC | PRN
  Administered 2022-07-31: 1.5 mL via SUBMUCOSAL

## 2022-07-31 MED ORDER — LIDOCAINE HCL (CARDIAC) PF 100 MG/5ML IV SOSY
PREFILLED_SYRINGE | INTRAVENOUS | Status: DC | PRN
  Administered 2022-07-31: 100 mg via INTRAVENOUS

## 2022-07-31 MED ORDER — PROPOFOL 10 MG/ML IV BOLUS
INTRAVENOUS | Status: AC
  Filled 2022-07-31: qty 40

## 2022-07-31 MED ORDER — GLYCOPYRROLATE 0.2 MG/ML IJ SOLN
INTRAMUSCULAR | Status: DC | PRN
  Administered 2022-07-31: .2 mg via INTRAVENOUS

## 2022-07-31 MED ORDER — GLYCOPYRROLATE 0.2 MG/ML IJ SOLN
INTRAMUSCULAR | Status: AC
  Filled 2022-07-31: qty 1

## 2022-07-31 NOTE — Transfer of Care (Signed)
Immediate Anesthesia Transfer of Care Note  Patient: Courtney Ball  Procedure(s) Performed: COLONOSCOPY WITH PROPOFOL ESOPHAGOGASTRODUODENOSCOPY (EGD) WITH PROPOFOL  Patient Location: Endoscopy Unit  Anesthesia Type:General  Level of Consciousness: drowsy  Airway & Oxygen Therapy: Patient Spontanous Breathing  Post-op Assessment: Report given to RN and Post -op Vital signs reviewed and stable  Post vital signs: Reviewed and stable  Last Vitals:  Vitals Value Taken Time  BP 174/84 1305  Temp 36.1 1305  Pulse 76 1305  Resp 16 1305  SpO2 98 1305    Last Pain:  Vitals:   07/31/22 1152  TempSrc: Temporal  PainSc: 0-No pain         Complications: No notable events documented.

## 2022-07-31 NOTE — Op Note (Signed)
[facility] Gastroenterology Patient Name: Courtney Ball Procedure Date: 07/31/2022 12:31 PM MRN: [id number] Account #: [id number] Date of Birth: 1961/01/06 Admit Type: Outpatient Age: 62 Room: [facility] ENDO ROOM 1 Gender: Female Note Status: Finalized Instrument Name: Peds Colonoscope [id number] Procedure:             Colonoscopy Indications:           Abnormal CT of the GI tract Providers:             Eather Colas MD, MD Referring MD:          Bonnita Nasuti j. [facility], dr (Referring MD) Medicines:             Monitored Anesthesia Care Complications:         No immediate complications. Estimated blood loss:                         Minimal. Procedure:             Pre-Anesthesia Assessment:                        - Prior to the procedure, a History and Physical was                         performed, and patient medications and allergies were                         reviewed. The patient is competent. The risks and                         benefits of the procedure and the sedation options and                         risks were discussed with the patient. All questions                         were answered and informed consent was obtained.                         Patient identification and proposed procedure were                         verified by the physician, the nurse, the                         anesthesiologist, the anesthetist and the technician                         in the endoscopy suite. Mental Status Examination:                         alert and oriented. Airway Examination: normal                         oropharyngeal airway and neck mobility. Respiratory                         Examination: clear to auscultation. CV Examination:  normal. Prophylactic Antibiotics: The patient does not                         require prophylactic antibiotics. Prior                         Anticoagulants: The patient has taken no  anticoagulant                         or antiplatelet agents. ASA Grade Assessment: III - A                         patient with severe systemic disease. After reviewing                         the risks and benefits, the patient was deemed in                         satisfactory condition to undergo the procedure. The                         anesthesia plan was to use monitored anesthesia care                         (MAC). Immediately prior to administration of                         medications, the patient was re-assessed for adequacy                         to receive sedatives. The heart rate, respiratory                         rate, oxygen saturations, blood pressure, adequacy of                         pulmonary ventilation, and response to care were                         monitored throughout the procedure. The physical                         status of the patient was re-assessed after the                         procedure.                        After obtaining informed consent, the colonoscope was                         passed under direct vision. Throughout the procedure,                         the patient's blood pressure, pulse, and oxygen                         saturations were monitored continuously. The  Colonoscope was introduced through the anus with the                         intention of advancing to the cecum. The scope was                         advanced to the sigmoid colon before the procedure was                         aborted. Medications were given. The colonoscopy was                         aborted due to a partially obstructing mass. Findings:      The perianal and digital rectal examinations were normal.      A frond-like/villous and fungating partially obstructing large mass was       found in the sigmoid colon. This was about 20 cm from anal verge. The       mass was partially circumferential (involving two-thirds of the  lumen       circumference). The peds colonoscope was not able to pass the mass. No       bleeding was present. This was biopsied with a cold forceps for       histology. Area was tattooed with an injection of [place] ink.      A 4 mm polyp was found in the rectum. The polyp was sessile. The polyp       was removed with a cold snare. Resection and retrieval were complete.       Estimated blood loss was minimal.      Internal hemorrhoids were found during retroflexion. The hemorrhoids       were Grade II (internal hemorrhoids that prolapse but reduce       spontaneously).      The exam was otherwise without abnormality on direct and retroflexion       views. Impression:            - The procedure was aborted due to partially                         obstructing mass.                        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 Biopsied. Tattooed.                        - One 4 mm polyp in the rectum, removed with a cold                         snare. Resected and retrieved.                        - Internal hemorrhoids.                        - The examination was otherwise normal on direct and  retroflexion views. Recommendation:        - Discharge patient to home.                        - Resume previous diet.                        - Continue present medications.                        - Await pathology results.                        - Refer to a surgeon at appointment to be scheduled.                        - Refer to an oncologist at appointment to be                         scheduled.                        - Return to referring physician as previously                         scheduled.                        - Perform CT scan (computed tomography) of the chest                         with contrast at appointment to be scheduled. Procedure Code(s):     --- Professional ---                        [phone],  52, Colonoscopy, flexible; with removal of                         tumor(s), polyp(s), or other lesion(s) by snare                         technique                        45380, 59,52, Colonoscopy, flexible; with biopsy,                         single or multiple                        45381, 52, Colonoscopy, flexible; with directed                         submucosal injection(s), any substance Diagnosis Code(s):     --- Professional ---                        K64.1, Second degree hemorrhoids                        D49.0, Neoplasm of unspecified behavior of digestive  system                        K56.690, Other partial intestinal obstruction                        D12.8, Benign neoplasm of rectum                        R93.3, Abnormal findings on diagnostic imaging of                         other parts of digestive tract CPT copyright 2022 American Medical Association. All rights reserved. The codes documented in this report are preliminary and upon coder review may  be revised to meet current compliance requirements. Eather Colas MD, MD 07/31/2022 1:13:35 PM Number of Addenda: 0 Note Initiated On: 07/31/2022 12:31 PM Total Procedure Duration: 0 hours 11 minutes 9 seconds  Estimated Blood Loss:  Estimated blood loss was minimal.      [facility]

## 2022-07-31 NOTE — H&P (Signed)
Outpatient short stay form Pre-procedure 07/31/2022  [name], MD  Primary Physician: Center, [facility]  Reason for visit:  Weight loss/Abnormal imaging  History of present illness:    62 y/o lady with history of constipation who presented to the ED in April with diarrhea/hematochezia and CT findings of left sided colitis. Symptoms have resolved. No blood thinners. History of umbilical hernia. Had a uncle with colon cancer.    Current Facility-Administered Medications:    0.9 %  sodium chloride infusion, , Intravenous, Continuous, Adlee Paar, Rossie Muskrat, MD, Last Rate: 20 mL/hr at 07/31/22 1210, Continued from Pre-op at 07/31/22 1210  Medications Prior to Admission  Medication Sig Dispense Refill Last Dose   acetaminophen (TYLENOL) 325 MG tablet Take 650 mg by mouth every 6 (six) hours as needed for headache or moderate pain.   Past Week   naproxen (NAPROSYN) 500 MG tablet Take 1 tablet (500 mg total) by mouth 2 (two) times daily with a meal. 20 tablet 0 Past Week   predniSONE (DELTASONE) 10 MG tablet Take 4 tabs daily x1 week then decrease by 1/2 tab daily until gone 42 tablet 0 Past Week   Dextromethorphan-Guaifenesin 60-1200 MG 12hr tablet Take by mouth. (Patient not taking: Reported on 04/04/2020)      linaCLOtide (LINZESS PO) Take by mouth 2 (two) times daily. (Patient not taking: Reported on 04/04/2020)      polyethylene glycol powder (GLYCOLAX/MIRALAX) powder Take 1-2 capfuls (17-34g) daily.  Mix in 4-8ounces of fluid prior to taking. (Patient not taking: Reported on 04/04/2020)        Allergies  Allergen Reactions   Sulfa Antibiotics Hives     Past Medical History:  Diagnosis Date   Bradycardia    Carpal tunnel syndrome    Constipation    Drug abuse (HCC)    Heart murmur    Valvular heart disease     Review of systems:  Otherwise negative.    Physical Exam  Gen: Alert, oriented. Appears stated age.  HEENT: PERRLA. Lungs: No respiratory  distress CV: RRR Abd: soft, benign, no masses Ext: No edema    Planned procedures: Proceed with EGD/colonoscopy. The patient understands the nature of the planned procedure, indications, risks, alternatives and potential complications including but not limited to bleeding, infection, perforation, damage to internal organs and possible oversedation/side effects from anesthesia. The patient agrees and gives consent to proceed.  Please refer to procedure notes for findings, recommendations and patient disposition/instructions.     [name], MD [facility] Gastroenterology

## 2022-07-31 NOTE — Anesthesia Preprocedure Evaluation (Signed)
Anesthesia Evaluation  Patient identified by MRN, date of birth, ID band Patient awake    Reviewed: Allergy & Precautions, NPO status , Patient's Chart, lab work & pertinent test results  History of Anesthesia Complications Negative for: history of anesthetic complications  Airway Mallampati: III  TM Distance: >3 FB Neck ROM: full    Dental  (+) Chipped, Poor Dentition, Missing   Pulmonary neg pulmonary ROS, neg shortness of breath   Pulmonary exam normal        Cardiovascular Exercise Tolerance: Good (-) angina Normal cardiovascular exam+ dysrhythmias      Neuro/Psych  PSYCHIATRIC DISORDERS       Neuromuscular disease    GI/Hepatic negative GI ROS, Neg liver ROS,neg GERD  ,,  Endo/Other  negative endocrine ROS    Renal/GU negative Renal ROS  negative genitourinary   Musculoskeletal   Abdominal   Peds  Hematology negative hematology ROS (+)   Anesthesia Other Findings Past Medical History: No date: Bradycardia No date: Carpal tunnel syndrome No date: Constipation No date: Drug abuse (HCC) No date: Heart murmur No date: Valvular heart disease  Past Surgical History: No date: COLONOSCOPY 2006: HERNIA REPAIR     Comment:  ventral  BMI    Body Mass Index: 19.40 kg/m      Reproductive/Obstetrics negative OB ROS                             Anesthesia Physical Anesthesia Plan  ASA: 3  Anesthesia Plan: General   Post-op Pain Management:    Induction: Intravenous  PONV Risk Score and Plan: Propofol infusion and TIVA  Airway Management Planned: Natural Airway and Nasal Cannula  Additional Equipment:   Intra-op Plan:   Post-operative Plan:   Informed Consent: I have reviewed the patients History and Physical, chart, labs and discussed the procedure including the risks, benefits and alternatives for the proposed anesthesia with the patient or authorized representative who  has indicated his/her understanding and acceptance.     Dental Advisory Given  Plan Discussed with: Anesthesiologist, CRNA and Surgeon  Anesthesia Plan Comments: (Patient consented for risks of anesthesia including but not limited to:  - adverse reactions to medications - risk of airway placement if required - damage to eyes, teeth, lips or other oral mucosa - nerve damage due to positioning  - sore throat or hoarseness - Damage to heart, brain, nerves, lungs, other parts of body or loss of life  Patient voiced understanding.)       Anesthesia Quick Evaluation

## 2022-07-31 NOTE — Interval H&P Note (Signed)
History and Physical Interval Note:  07/31/2022 12:34 PM  Courtney Ball  has presented today for surgery, with the diagnosis of diarrhea abnormal ct weight loss left sided colitis with rectal  bleeding hematochezia.  The various methods of treatment have been discussed with the patient and family. After consideration of risks, benefits and other options for treatment, the patient has consented to  Procedure(s): COLONOSCOPY WITH PROPOFOL (N/A) ESOPHAGOGASTRODUODENOSCOPY (EGD) WITH PROPOFOL (N/A) as a surgical intervention.  The patient's history has been reviewed, patient examined, no change in status, stable for surgery.  I have reviewed the patient's chart and labs.  Questions were answered to the patient's satisfaction.     [name]  Ok to proceed with EGD/Colonoscopy

## 2022-07-31 NOTE — Anesthesia Postprocedure Evaluation (Signed)
Anesthesia Post Note  Patient: Shye A Chichester  Procedure(s) Performed: COLONOSCOPY WITH PROPOFOL ESOPHAGOGASTRODUODENOSCOPY (EGD) WITH PROPOFOL  Patient location during evaluation: Endoscopy Anesthesia Type: General Level of consciousness: awake and alert Pain management: pain level controlled Vital Signs Assessment: post-procedure vital signs reviewed and stable Respiratory status: spontaneous breathing, nonlabored ventilation, respiratory function stable and patient connected to nasal cannula oxygen Cardiovascular status: blood pressure returned to baseline and stable Postop Assessment: no apparent nausea or vomiting Anesthetic complications: no   No notable events documented.   Last Vitals:  Vitals:   07/31/22 1325 07/31/22 1335  BP: 101/70 99/68  Pulse: (!) 104 (!) 103  Resp: 18 14  Temp:    SpO2: 98% 99%    Last Pain:  Vitals:   07/31/22 1315  TempSrc:   PainSc: 0-No pain                 Cleda Mccreedy Bronco Mcgrory

## 2022-07-31 NOTE — Op Note (Signed)
[facility] Gastroenterology Patient Name: Courtney Ball Procedure Date: 07/31/2022 12:33 PM MRN: [id number] Account #: [id number] Date of Birth: 1961-02-04 Admit Type: Outpatient Age: 62 Room: [facility] ENDO ROOM 1 Gender: Female Note Status: Finalized Instrument Name: Patton Salles Endoscope [id number] Procedure:             Upper GI endoscopy Indications:           Weight loss Providers:             Eather Colas MD, MD Referring MD:          Bonnita Nasuti j. [facility], dr (Referring MD) Medicines:             Monitored Anesthesia Care Complications:         No immediate complications. Estimated blood loss:                         Minimal. Procedure:             Pre-Anesthesia Assessment:                        - Prior to the procedure, a History and Physical was                         performed, and patient medications and allergies were                         reviewed. The patient is competent. The risks and                         benefits of the procedure and the sedation options and                         risks were discussed with the patient. All questions                         were answered and informed consent was obtained.                         Patient identification and proposed procedure were                         verified by the physician, the nurse, the                         anesthesiologist, the anesthetist and the technician                         in the endoscopy suite. Mental Status Examination:                         alert and oriented. Airway Examination: normal                         oropharyngeal airway and neck mobility. Respiratory                         Examination: clear to auscultation. CV Examination:  normal. Prophylactic Antibiotics: The patient does not                         require prophylactic antibiotics. Prior                         Anticoagulants: The patient has taken no anticoagulant                          or antiplatelet agents. ASA Grade Assessment: III - A                         patient with severe systemic disease. After reviewing                         the risks and benefits, the patient was deemed in                         satisfactory condition to undergo the procedure. The                         anesthesia plan was to use monitored anesthesia care                         (MAC). Immediately prior to administration of                         medications, the patient was re-assessed for adequacy                         to receive sedatives. The heart rate, respiratory                         rate, oxygen saturations, blood pressure, adequacy of                         pulmonary ventilation, and response to care were                         monitored throughout the procedure. The physical                         status of the patient was re-assessed after the                         procedure.                        After obtaining informed consent, the endoscope was                         passed under direct vision. Throughout the procedure,                         the patient's blood pressure, pulse, and oxygen                         saturations were monitored continuously. The Endoscope  was introduced through the mouth, and advanced to the                         second part of duodenum. The upper GI endoscopy was                         accomplished without difficulty. The patient tolerated                         the procedure well. Findings:      The examined esophagus was normal.      A small hiatal hernia was present.      Patchy mild inflammation characterized by erythema was found in the       stomach. Biopsies were taken with a cold forceps for Helicobacter pylori       testing. Estimated blood loss was minimal.      The examined duodenum was normal. Impression:            - Normal esophagus.                        - Small  hiatal hernia.                        - Gastritis. Biopsied.                        - Normal examined duodenum. Recommendation:        - Discharge patient to home.                        - Resume previous diet.                        - Continue present medications.                        - Await pathology results.                        - Return to referring physician as previously                         scheduled. Procedure Code(s):     --- Professional ---                        [phone], Esophagogastroduodenoscopy, flexible,                         transoral; with biopsy, single or multiple Diagnosis Code(s):     --- Professional ---                        K44.9, Diaphragmatic hernia without obstruction or                         gangrene                        K29.70, Gastritis, unspecified, without bleeding                        R63.4,  Abnormal weight loss CPT copyright 2022 American Medical Association. All rights reserved. The codes documented in this report are preliminary and upon coder review may  be revised to meet current compliance requirements. Eather Colas MD, MD 07/31/2022 1:08:14 PM Number of Addenda: 0 Note Initiated On: 07/31/2022 12:33 PM Estimated Blood Loss:  Estimated blood loss was minimal.      [facility]

## 2022-08-01 ENCOUNTER — Encounter: Payer: Self-pay | Admitting: Gastroenterology

## 2022-08-01 LAB — SURGICAL PATHOLOGY

## 2022-08-03 ENCOUNTER — Other Ambulatory Visit: Payer: Self-pay | Admitting: Gastroenterology

## 2022-08-03 ENCOUNTER — Inpatient Hospital Stay: Payer: 59 | Attending: Oncology | Admitting: Oncology

## 2022-08-03 ENCOUNTER — Inpatient Hospital Stay: Payer: 59

## 2022-08-03 VITALS — BP 124/76 | HR 87 | Temp 99.2°F | Resp 18 | Ht 64.0 in | Wt 118.0 lb

## 2022-08-03 DIAGNOSIS — C187 Malignant neoplasm of sigmoid colon: Secondary | ICD-10-CM | POA: Diagnosis not present

## 2022-08-03 DIAGNOSIS — Z791 Long term (current) use of non-steroidal anti-inflammatories (NSAID): Secondary | ICD-10-CM | POA: Insufficient documentation

## 2022-08-03 DIAGNOSIS — R001 Bradycardia, unspecified: Secondary | ICD-10-CM | POA: Diagnosis not present

## 2022-08-03 DIAGNOSIS — F1490 Cocaine use, unspecified, uncomplicated: Secondary | ICD-10-CM | POA: Insufficient documentation

## 2022-08-03 DIAGNOSIS — M129 Arthropathy, unspecified: Secondary | ICD-10-CM | POA: Diagnosis not present

## 2022-08-03 DIAGNOSIS — R1904 Left lower quadrant abdominal swelling, mass and lump: Secondary | ICD-10-CM | POA: Insufficient documentation

## 2022-08-03 DIAGNOSIS — R197 Diarrhea, unspecified: Secondary | ICD-10-CM | POA: Diagnosis not present

## 2022-08-03 DIAGNOSIS — Z7952 Long term (current) use of systemic steroids: Secondary | ICD-10-CM | POA: Diagnosis not present

## 2022-08-03 DIAGNOSIS — R011 Cardiac murmur, unspecified: Secondary | ICD-10-CM | POA: Diagnosis not present

## 2022-08-03 DIAGNOSIS — F331 Major depressive disorder, recurrent, moderate: Secondary | ICD-10-CM | POA: Diagnosis not present

## 2022-08-03 DIAGNOSIS — K6389 Other specified diseases of intestine: Secondary | ICD-10-CM | POA: Diagnosis not present
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
## 2022-08-04 LAB — CEA: CEA: 1.3 ng/mL (ref 0.0–4.7)

## 2022-08-06 ENCOUNTER — Encounter: Payer: Self-pay | Admitting: Oncology

## 2022-08-07 DIAGNOSIS — C187 Malignant neoplasm of sigmoid colon: Secondary | ICD-10-CM | POA: Diagnosis not present

## 2022-08-14 DIAGNOSIS — M25511 Pain in right shoulder: Secondary | ICD-10-CM | POA: Diagnosis not present

## 2022-08-16 DIAGNOSIS — M25511 Pain in right shoulder: Secondary | ICD-10-CM | POA: Diagnosis not present

## 2022-08-17 DIAGNOSIS — F331 Major depressive disorder, recurrent, moderate: Secondary | ICD-10-CM | POA: Diagnosis not present

## 2022-08-20 ENCOUNTER — Ambulatory Visit: Payer: 59

## 2022-08-21 ENCOUNTER — Ambulatory Visit
Admission: RE | Admit: 2022-08-21 | Discharge: 2022-08-21 | Disposition: A | Discharge status: Home / Self Care | Payer: 59 | Source: Ambulatory Visit | Attending: Oncology | Admitting: Oncology

## 2022-08-21 DIAGNOSIS — I2510 Atherosclerotic heart disease of native coronary artery without angina pectoris: Secondary | ICD-10-CM | POA: Diagnosis not present

## 2022-08-21 DIAGNOSIS — C187 Malignant neoplasm of sigmoid colon: Secondary | ICD-10-CM | POA: Diagnosis not present

## 2022-08-21 DIAGNOSIS — I700 Atherosclerosis of aorta: Secondary | ICD-10-CM | POA: Diagnosis not present

## 2022-08-21 DIAGNOSIS — J439 Emphysema, unspecified: Secondary | ICD-10-CM | POA: Diagnosis not present

## 2022-08-21 MED ORDER — IOPAMIDOL (ISOVUE-300) INJECTION 61%
100.0000 mL | Freq: Once | INTRAVENOUS | Status: AC | PRN
  Administered 2022-08-21: 100 mL via INTRAVENOUS

## 2022-08-23 ENCOUNTER — Other Ambulatory Visit (HOSPITAL_COMMUNITY): Payer: 59

## 2022-08-27 DIAGNOSIS — C187 Malignant neoplasm of sigmoid colon: Secondary | ICD-10-CM | POA: Diagnosis not present

## 2022-08-31 DIAGNOSIS — F331 Major depressive disorder, recurrent, moderate: Secondary | ICD-10-CM | POA: Diagnosis not present

## 2022-09-05 ENCOUNTER — Telehealth: Payer: Self-pay

## 2022-09-05 NOTE — Telephone Encounter (Signed)
Noted that surgery was rescheduled to 09/11/22. Reached out to Ms. Chamberlain and she states it was rescheduled due to insurance authorization. We will reschedule her appointment with Dr. Smith Robert. Message sent to scheduling.

## 2022-09-10 DIAGNOSIS — Z01818 Encounter for other preprocedural examination: Secondary | ICD-10-CM | POA: Diagnosis not present

## 2022-09-10 DIAGNOSIS — C187 Malignant neoplasm of sigmoid colon: Secondary | ICD-10-CM | POA: Diagnosis not present

## 2022-09-10 DIAGNOSIS — Z01812 Encounter for preprocedural laboratory examination: Secondary | ICD-10-CM | POA: Diagnosis not present

## 2022-09-18 ENCOUNTER — Ambulatory Visit: Payer: 59 | Admitting: Oncology

## 2022-09-24 DIAGNOSIS — C19 Malignant neoplasm of rectosigmoid junction: Secondary | ICD-10-CM | POA: Diagnosis not present

## 2022-09-24 DIAGNOSIS — C187 Malignant neoplasm of sigmoid colon: Secondary | ICD-10-CM | POA: Diagnosis not present

## 2022-09-24 DIAGNOSIS — K5669 Other partial intestinal obstruction: Secondary | ICD-10-CM | POA: Diagnosis not present

## 2022-09-24 DIAGNOSIS — R627 Adult failure to thrive: Secondary | ICD-10-CM | POA: Diagnosis not present

## 2022-09-24 DIAGNOSIS — F1210 Cannabis abuse, uncomplicated: Secondary | ICD-10-CM | POA: Diagnosis not present

## 2022-09-24 DIAGNOSIS — K56600 Partial intestinal obstruction, unspecified as to cause: Secondary | ICD-10-CM | POA: Diagnosis not present

## 2022-09-24 DIAGNOSIS — E119 Type 2 diabetes mellitus without complications: Secondary | ICD-10-CM | POA: Diagnosis not present

## 2022-09-24 DIAGNOSIS — R634 Abnormal weight loss: Secondary | ICD-10-CM | POA: Diagnosis not present

## 2022-09-24 DIAGNOSIS — Z882 Allergy status to sulfonamides status: Secondary | ICD-10-CM | POA: Diagnosis not present

## 2022-09-24 DIAGNOSIS — Z6821 Body mass index (BMI) 21.0-21.9, adult: Secondary | ICD-10-CM | POA: Diagnosis not present

## 2022-09-24 DIAGNOSIS — K660 Peritoneal adhesions (postprocedural) (postinfection): Secondary | ICD-10-CM | POA: Diagnosis not present

## 2022-09-24 DIAGNOSIS — C189 Malignant neoplasm of colon, unspecified: Secondary | ICD-10-CM | POA: Diagnosis not present

## 2022-09-24 DIAGNOSIS — F1410 Cocaine abuse, uncomplicated: Secondary | ICD-10-CM | POA: Diagnosis not present

## 2022-09-25 ENCOUNTER — Inpatient Hospital Stay: Payer: 59 | Admitting: Oncology

## 2022-10-03 DIAGNOSIS — K838 Other specified diseases of biliary tract: Secondary | ICD-10-CM | POA: Diagnosis not present

## 2022-10-03 DIAGNOSIS — K529 Noninfective gastroenteritis and colitis, unspecified: Secondary | ICD-10-CM | POA: Diagnosis not present

## 2022-10-03 DIAGNOSIS — K9189 Other postprocedural complications and disorders of digestive system: Secondary | ICD-10-CM | POA: Diagnosis not present

## 2022-10-03 DIAGNOSIS — Z9049 Acquired absence of other specified parts of digestive tract: Secondary | ICD-10-CM | POA: Diagnosis not present

## 2022-10-03 DIAGNOSIS — R109 Unspecified abdominal pain: Secondary | ICD-10-CM | POA: Diagnosis not present

## 2022-10-03 DIAGNOSIS — R5082 Postprocedural fever: Secondary | ICD-10-CM | POA: Diagnosis not present

## 2022-10-03 DIAGNOSIS — K567 Ileus, unspecified: Secondary | ICD-10-CM | POA: Diagnosis not present

## 2022-10-03 DIAGNOSIS — K6389 Other specified diseases of intestine: Secondary | ICD-10-CM | POA: Diagnosis not present

## 2022-10-03 DIAGNOSIS — R112 Nausea with vomiting, unspecified: Secondary | ICD-10-CM | POA: Diagnosis not present

## 2022-10-03 DIAGNOSIS — Y836 Removal of other organ (partial) (total) as the cause of abnormal reaction of the patient, or of later complication, without mention of misadventure at the time of the procedure: Secondary | ICD-10-CM | POA: Diagnosis not present

## 2022-10-03 DIAGNOSIS — G8918 Other acute postprocedural pain: Secondary | ICD-10-CM | POA: Diagnosis not present

## 2022-10-08 ENCOUNTER — Inpatient Hospital Stay: Payer: 59 | Admitting: Oncology

## 2022-10-23 ENCOUNTER — Inpatient Hospital Stay: Payer: 59 | Attending: Oncology | Admitting: Oncology

## 2022-10-23 ENCOUNTER — Inpatient Hospital Stay: Payer: 59

## 2022-10-23 ENCOUNTER — Encounter: Payer: Self-pay | Admitting: Oncology

## 2022-10-23 VITALS — BP 133/74 | HR 63 | Temp 97.2°F | Resp 18 | Ht 64.0 in | Wt 117.9 lb

## 2022-10-23 DIAGNOSIS — C187 Malignant neoplasm of sigmoid colon: Secondary | ICD-10-CM

## 2022-10-23 DIAGNOSIS — Z7189 Other specified counseling: Secondary | ICD-10-CM | POA: Diagnosis not present

## 2022-10-23 DIAGNOSIS — D649 Anemia, unspecified: Secondary | ICD-10-CM | POA: Insufficient documentation

## 2022-10-23 LAB — CBC WITH DIFFERENTIAL (CANCER CENTER ONLY)
Abs Immature Granulocytes: 0.01 10*3/uL (ref 0.00–0.07)
Basophils Absolute: 0 10*3/uL (ref 0.0–0.1)
Basophils Relative: 1 %
Eosinophils Absolute: 0 10*3/uL (ref 0.0–0.5)
Eosinophils Relative: 1 %
HCT: 34.3 % — ABNORMAL LOW (ref 36.0–46.0)
Hemoglobin: 10.5 g/dL — ABNORMAL LOW (ref 12.0–15.0)
Immature Granulocytes: 0 %
Lymphocytes Relative: 30 %
Lymphs Abs: 1 10*3/uL (ref 0.7–4.0)
MCH: 27.7 pg (ref 26.0–34.0)
MCHC: 30.6 g/dL (ref 30.0–36.0)
MCV: 90.5 fL (ref 80.0–100.0)
Monocytes Absolute: 0.2 10*3/uL (ref 0.1–1.0)
Monocytes Relative: 7 %
Neutro Abs: 2 10*3/uL (ref 1.7–7.7)
Neutrophils Relative %: 61 %
Platelet Count: 358 10*3/uL (ref 150–400)
RBC: 3.79 MIL/uL — ABNORMAL LOW (ref 3.87–5.11)
RDW: 14.3 % (ref 11.5–15.5)
WBC Count: 3.3 10*3/uL — ABNORMAL LOW (ref 4.0–10.5)
nRBC: 0 % (ref 0.0–0.2)

## 2022-10-23 LAB — IRON AND TIBC
Iron: 104 ug/dL (ref 28–170)
Saturation Ratios: 31 % (ref 10.4–31.8)
TIBC: 332 ug/dL (ref 250–450)
UIBC: 228 ug/dL

## 2022-10-23 LAB — VITAMIN B12: Vitamin B-12: 530 pg/mL (ref 180–914)

## 2022-10-23 LAB — FERRITIN: Ferritin: 50 ng/mL (ref 11–307)

## 2022-10-23 LAB — FOLATE: Folate: 7.9 ng/mL (ref >5.9)

## 2022-10-23 NOTE — Progress Notes (Signed)
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  Which showed evidence of severe colitis in the mid sigmoid colon.  This was followed by a colonoscopy which showed villous and fungating partially obstructing mass in the sigmoid colon 20 cm from the anal verge.  Mass involving two thirds of the lumen circumference.  Peds colonoscopy was not able to be passed.  4 mm polyp was also found in the rectum.  Mass was biopsied and was consistent with at least intramucosal adenocarcinoma but cannot exclude invasion into the submucosa.  Ferritin was borderline low at 35 with an iron saturation of 14%.  H&H normal at 12.3/36.8.   Patient had rectosigmoid colon resection at [facility] in July 2024.  Final pathology showed 6.2 cm tumor with negative margins.  No evidence of perineural or lymphatic invasion.  Tumor budding score low.  64 lymph nodes negative for malignancy.  pT2 N0.   Interval history-patient has recovered well from her surgery.  Reports some ongoing fatigue as well as constipation.  ECOG PS- 1 Pain scale- 0   Review of systems- Review of Systems  Constitutional:  Negative for chills, fever, malaise/fatigue and weight loss.  HENT:  Negative for congestion, ear discharge and nosebleeds.   Eyes:  Negative for blurred  vision.  Respiratory:  Negative for cough, hemoptysis, sputum production, shortness of breath and wheezing.   Cardiovascular:  Negative for chest pain, palpitations, orthopnea and claudication.  Gastrointestinal:  Negative for abdominal pain, blood in stool, constipation, diarrhea, heartburn, melena, nausea and vomiting.  Genitourinary:  Negative for dysuria, flank pain, frequency, hematuria and urgency.  Musculoskeletal:  Negative for back pain, joint pain and myalgias.  Skin:  Negative for rash.  Neurological:  Negative for dizziness, tingling, focal weakness, seizures, weakness and headaches.  Endo/Heme/Allergies:  Does not bruise/bleed easily.  Psychiatric/Behavioral:  Negative for depression and suicidal ideas. The patient does not have insomnia.       Allergies  Allergen Reactions   Sulfa Antibiotics Hives     Past Medical History:  Diagnosis Date   Bradycardia    Carpal tunnel syndrome    Constipation    Drug abuse (HCC)    Heart murmur    Valvular heart disease      Past Surgical History:  Procedure Laterality Date   COLONOSCOPY     COLONOSCOPY WITH PROPOFOL N/A 07/31/2022   Procedure: COLONOSCOPY WITH PROPOFOL;  Surgeon: [name], MD;  Location: [facility] ENDOSCOPY;  Service: Endoscopy;  Laterality: N/A;   ESOPHAGOGASTRODUODENOSCOPY (EGD) WITH PROPOFOL N/A 07/31/2022   Procedure: ESOPHAGOGASTRODUODENOSCOPY (EGD) WITH PROPOFOL;  Surgeon: [name], MD;  Location: [facility] ENDOSCOPY;  Service: Endoscopy;  Laterality: N/A;   HERNIA REPAIR  2006   ventral    Social History   Socioeconomic History   Marital status: Single    Spouse name: Not on file   Number of children: Not on file  Years of education: Not on file   Highest education level: Not on file  Occupational History   Not on file  Tobacco Use   Smoking status: Never   Smokeless tobacco: Never  Vaping Use   Vaping status: Never Used  Substance and Sexual Activity   Alcohol use: Not  Currently    Alcohol/week: 0.0 standard drinks of alcohol    Comment: "3 years ago"   Drug use: Yes    Types: Cocaine, Marijuana    Comment: cocaine 1 week ago from 07/31/22   Sexual activity: Yes    Partners: Male    Birth control/protection: Post-menopausal  Other Topics Concern   Not on file  Social History Narrative   Not on file   Social Determinants of Health   Financial Resource Strain: Medium Risk (10/04/2022)   Received from [facility]   Overall Financial Resource Strain (CARDIA)    Difficulty of Paying Living Expenses: Somewhat hard  Food Insecurity: No Food Insecurity (10/04/2022)   Received from [facility]   Hunger Vital Sign    Worried About Running Out of Food in the Last Year: Never true    Ran Out of Food in the Last Year: Never true  Recent Concern: Food Insecurity - Food Insecurity Present (08/03/2022)   Hunger Vital Sign    Worried About Running Out of Food in the Last Year: Never true    Ran Out of Food in the Last Year: Sometimes true  Transportation Needs: Unmet Transportation Needs (10/04/2022)   Received from [facility]   PRAPARE - Transportation    Lack of Transportation (Medical): Yes    Lack of Transportation (Non-Medical): Yes  Physical Activity: Insufficiently Active (10/04/2022)   Received from [facility]   Exercise Vital Sign    Days of Exercise per Week: 3 days    Minutes of Exercise per Session: 30 min  Stress: No Stress Concern Present (10/04/2022)   Received from [facility] - Occupational Stress Questionnaire    Feeling of Stress : Only a little  Social Connections: Socially Isolated (10/04/2022)   Received from [facility]   Social Connection and Isolation Panel [NHANES]    Frequency of Communication with Friends and Family: More than three times a week    Frequency of Social Gatherings with Friends and Family: More than three times a week    Attends Religious Services:  Never    Database administrator or Organizations: No    Attends Banker Meetings: Never    Marital Status: Never married  Intimate Partner Violence: Not At Risk (10/04/2022)   Received from [facility]   Humiliation, Afraid, Rape, and Kick questionnaire    Fear of Current or Ex-Partner: No    Emotionally Abused: No    Physically Abused: No    Sexually Abused: No    Family History  Problem Relation Age of Onset   Breast cancer Neg Hx      Current Outpatient Medications:    acetaminophen (TYLENOL) 325 MG tablet, Take 650 mg by mouth every 6 (six) hours as needed for headache or moderate pain. (Patient not taking: Reported on 10/23/2022), Disp: , Rfl:    Dextromethorphan-Guaifenesin 60-1200 MG 12hr tablet, Take by mouth. (Patient not taking: Reported on 04/04/2020), Disp: , Rfl:    linaCLOtide (LINZESS PO), Take by mouth 2 (two) times daily. (Patient not taking: Reported on 04/04/2020), Disp: ,  Rfl:    naproxen (NAPROSYN) 500 MG tablet, Take 1 tablet (500 mg total) by mouth 2 (two) times daily with a meal. (Patient not taking: Reported on 10/23/2022), Disp: 20 tablet, Rfl: 0   polyethylene glycol powder (GLYCOLAX/MIRALAX) powder, , Disp: , Rfl:    predniSONE (DELTASONE) 10 MG tablet, Take 4 tabs daily x1 week then decrease by 1/2 tab daily until gone (Patient not taking: Reported on 10/23/2022), Disp: 42 tablet, Rfl: 0  Physical exam:  Vitals:   10/23/22 1005  BP: 133/74  Pulse: 63  Resp: 18  Temp: (!) 97.2 F (36.2 C)  TempSrc: Tympanic  SpO2: 100%  Weight: 117 lb 14.4 oz (53.5 kg)  Height: 5\' 4"  (1.626 m)   Physical Exam Cardiovascular:     Rate and Rhythm: Normal rate and regular rhythm.     Heart sounds: Normal heart sounds.  Pulmonary:     Effort: Pulmonary effort is normal.     Breath sounds: Normal breath sounds.  Abdominal:     General: Bowel sounds are normal.     Palpations: Abdomen is soft.  Skin:    General: Skin is warm and dry.  Neurological:      Mental Status: She is alert and oriented to person, place, and time.         Latest Ref Rng & Units 06/16/2022    7:49 PM  CMP  Glucose 70 - 99 mg/dL 87   BUN 6 - 20 mg/dL 20   Creatinine 6.57 - 1.00 mg/dL 8.46   Sodium 962 - 952 mmol/L 134   Potassium 3.5 - 5.1 mmol/L 3.4   Chloride 98 - 111 mmol/L 104   CO2 22 - 32 mmol/L 25   Calcium 8.9 - 10.3 mg/dL 8.9   Total Protein 6.5 - 8.1 g/dL 7.4   Total Bilirubin 0.3 - 1.2 mg/dL 0.5   Alkaline Phos 38 - 126 U/L 57   AST 15 - 41 U/L 16   ALT 0 - 44 U/L 13       Latest Ref Rng & Units 06/16/2022    7:49 PM  CBC  WBC 4.0 - 10.5 K/uL 6.8   Hemoglobin 12.0 - 15.0 g/dL 84.1   Hematocrit 32.4 - 46.0 % 40.7   Platelets 150 - 400 K/uL 346     Assessment and plan- Patient is a 62 y.o. 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  I have reviewed final pathology results from rectosigmoid colon resection done at [facility] which shows a 6.2 cm tumor with negative margins.  64 lymph nodes negative for malignancy.  This constitutes stage I pT2 N0 disease.  She does not require any adjuvant chemotherapy at this time.  As per NCCN guidelines she will require surveillance colonoscopy a year after surgery and following that 3 years later and subsequently every 5 years.  I will reach out to Dr. Mia Creek regarding this.  Normocytic anemia: I will check CBC ferritin and iron studies B12 and folate today.  Repeat labs in 6 months and I will see her thereafter   Cancer Staging  Cancer of sigmoid colon [facility]) Staging form: Colon and Rectum, AJCC 8th Edition - Pathologic stage from 10/23/2022: Stage I (pT2, pN0, cM0) - Signed by Creig Hines, MD on 10/23/2022 Stage prefix: Initial diagnosis Total positive nodes: 0 Histologic grading system: 4 grade system Histologic grade (G): G2  Residual tumor (R): R0 - None     Visit Diagnosis 1. Cancer of  sigmoid colon (HCC)   2. Goals of care, counseling/discussion   3. Normocytic anemia      Dr. Owens Shark, MD, MPH [facility] at [facility] [phone] 10/23/2022 10:18 AM

## 2023-02-13 DIAGNOSIS — F331 Major depressive disorder, recurrent, moderate: Secondary | ICD-10-CM | POA: Diagnosis not present

## 2023-02-13 DIAGNOSIS — Z1389 Encounter for screening for other disorder: Secondary | ICD-10-CM | POA: Diagnosis not present

## 2023-02-13 DIAGNOSIS — R634 Abnormal weight loss: Secondary | ICD-10-CM | POA: Diagnosis not present

## 2023-02-13 DIAGNOSIS — Z85038 Personal history of other malignant neoplasm of large intestine: Secondary | ICD-10-CM | POA: Diagnosis not present

## 2023-02-26 DIAGNOSIS — R634 Abnormal weight loss: Secondary | ICD-10-CM | POA: Diagnosis not present

## 2023-02-26 DIAGNOSIS — F1493 Cocaine use, unspecified with withdrawal: Secondary | ICD-10-CM | POA: Diagnosis not present

## 2023-02-26 DIAGNOSIS — F331 Major depressive disorder, recurrent, moderate: Secondary | ICD-10-CM | POA: Diagnosis not present

## 2023-02-26 DIAGNOSIS — R7309 Other abnormal glucose: Secondary | ICD-10-CM | POA: Diagnosis not present

## 2023-03-01 ENCOUNTER — Encounter: Payer: Self-pay | Admitting: *Deleted

## 2023-03-21 ENCOUNTER — Telehealth: Payer: Self-pay

## 2023-03-21 NOTE — Telephone Encounter (Signed)
 The patient called in about her prep and I inform her that she called the wrong office. I gave her the number to [facility].

## 2023-03-25 ENCOUNTER — Ambulatory Visit: Payer: 59 | Admitting: Anesthesiology

## 2023-03-25 ENCOUNTER — Ambulatory Visit
Admission: RE | Admit: 2023-03-25 | Discharge: 2023-03-25 | Disposition: A | Discharge status: Home / Self Care | Payer: 59 | Attending: Gastroenterology | Admitting: Gastroenterology

## 2023-03-25 ENCOUNTER — Encounter
Admission: RE | Disposition: A | Discharge status: Home / Self Care | Payer: Self-pay | Source: Home / Self Care | Attending: Gastroenterology

## 2023-03-25 ENCOUNTER — Encounter: Payer: Self-pay | Admitting: *Deleted

## 2023-03-25 DIAGNOSIS — Z1211 Encounter for screening for malignant neoplasm of colon: Secondary | ICD-10-CM | POA: Diagnosis not present

## 2023-03-25 DIAGNOSIS — Z85038 Personal history of other malignant neoplasm of large intestine: Secondary | ICD-10-CM | POA: Diagnosis not present

## 2023-03-25 DIAGNOSIS — Z980 Intestinal bypass and anastomosis status: Secondary | ICD-10-CM | POA: Insufficient documentation

## 2023-03-25 DIAGNOSIS — K5140 Inflammatory polyps of colon without complications: Secondary | ICD-10-CM | POA: Diagnosis not present

## 2023-03-25 DIAGNOSIS — Z08 Encounter for follow-up examination after completed treatment for malignant neoplasm: Secondary | ICD-10-CM | POA: Diagnosis not present

## 2023-03-25 DIAGNOSIS — D122 Benign neoplasm of ascending colon: Secondary | ICD-10-CM | POA: Diagnosis not present

## 2023-03-25 DIAGNOSIS — K640 First degree hemorrhoids: Secondary | ICD-10-CM | POA: Insufficient documentation

## 2023-03-25 DIAGNOSIS — K641 Second degree hemorrhoids: Secondary | ICD-10-CM | POA: Diagnosis not present

## 2023-03-25 DIAGNOSIS — D123 Benign neoplasm of transverse colon: Secondary | ICD-10-CM | POA: Insufficient documentation

## 2023-03-25 DIAGNOSIS — K635 Polyp of colon: Secondary | ICD-10-CM | POA: Diagnosis not present

## 2023-03-25 HISTORY — PX: HEMOSTASIS CLIP PLACEMENT: SHX6857

## 2023-03-25 HISTORY — DX: Malignant (primary) neoplasm, unspecified: C80.1

## 2023-03-25 HISTORY — PX: HOT HEMOSTASIS: SHX5433

## 2023-03-25 HISTORY — PX: COLONOSCOPY WITH PROPOFOL: SHX5780

## 2023-03-25 HISTORY — PX: POLYPECTOMY: SHX5525

## 2023-03-25 SURGERY — COLONOSCOPY WITH PROPOFOL
Anesthesia: General

## 2023-03-25 MED ORDER — LIDOCAINE HCL (CARDIAC) PF 100 MG/5ML IV SOSY
PREFILLED_SYRINGE | INTRAVENOUS | Status: DC | PRN
  Administered 2023-03-25: 60 mg via INTRAVENOUS

## 2023-03-25 MED ORDER — PROPOFOL 10 MG/ML IV BOLUS
INTRAVENOUS | Status: DC | PRN
  Administered 2023-03-25: 70 mg via INTRAVENOUS

## 2023-03-25 MED ORDER — SODIUM CHLORIDE 0.9 % IV SOLN: INTRAVENOUS | Status: DC

## 2023-03-25 MED ORDER — PROPOFOL 500 MG/50ML IV EMUL
INTRAVENOUS | Status: DC | PRN
  Administered 2023-03-25: 140 ug/kg/min via INTRAVENOUS

## 2023-03-25 NOTE — H&P (Signed)
 Outpatient short stay form Pre-procedure 03/25/2023  Ole ONEIDA Schick, MD  Primary Physician: Center, [facility]  Reason for visit:  Personal history of colon cancer  History of present illness:    63 y/o lady with history of rectosigmoid colon cancer here for colonoscopy. Her surgery was in July. The reason for 6 month colonoscopy is the lesion was obstructing so rest of the colon was not examined. No blood thinners.     Current Facility-Administered Medications:    0.9 %  sodium chloride  infusion, , Intravenous, Continuous, Leylani Duley, Ole ONEIDA, MD, Last Rate: 20 mL/hr at 03/25/23 0734, New Bag at 03/25/23 0734  Medications Prior to Admission  Medication Sig Dispense Refill Last Dose/Taking   acetaminophen  (TYLENOL ) 325 MG tablet Take 650 mg by mouth every 6 (six) hours as needed for headache or moderate pain. (Patient not taking: Reported on 10/23/2022)      Dextromethorphan-Guaifenesin 60-1200 MG 12hr tablet Take by mouth. (Patient not taking: Reported on 04/04/2020)      linaCLOtide (LINZESS PO) Take by mouth 2 (two) times daily. (Patient not taking: Reported on 04/04/2020)      naproxen  (NAPROSYN ) 500 MG tablet Take 1 tablet (500 mg total) by mouth 2 (two) times daily with a meal. (Patient not taking: Reported on 10/23/2022) 20 tablet 0    polyethylene glycol powder (GLYCOLAX/MIRALAX) powder  (Patient not taking: Reported on 10/23/2022)      predniSONE  (DELTASONE ) 10 MG tablet Take 4 tabs daily x1 week then decrease by 1/2 tab daily until gone (Patient not taking: Reported on 10/23/2022) 42 tablet 0      Allergies  Allergen Reactions   Sulfa Antibiotics Hives     Past Medical History:  Diagnosis Date   Bradycardia    Cancer (HCC)    Carpal tunnel syndrome    Constipation    Drug abuse (HCC)    Heart murmur    Valvular heart disease     Review of systems:  Otherwise negative.    Physical Exam  Gen: Alert, oriented. Appears stated age.  HEENT:  PERRLA. Lungs: No respiratory distress CV: RRR Abd: soft, benign, no masses Ext: No edema    Planned procedures: Proceed with colonoscopy. The patient understands the nature of the planned procedure, indications, risks, alternatives and potential complications including but not limited to bleeding, infection, perforation, damage to internal organs and possible oversedation/side effects from anesthesia. The patient agrees and gives consent to proceed.  Please refer to procedure notes for findings, recommendations and patient disposition/instructions.     Ole ONEIDA Schick, MD [facility]

## 2023-03-25 NOTE — Interval H&P Note (Signed)
 History and Physical Interval Note:  03/25/2023 8:19 AM  Courtney Ball  has presented today for surgery, with the diagnosis of h/o sigmoid colon mass obstructing.  The various methods of treatment have been discussed with the patient and family. After consideration of risks, benefits and other options for treatment, the patient has consented to  Procedure(s): COLONOSCOPY WITH PROPOFOL  (N/A) as a surgical intervention.  The patient's history has been reviewed, patient examined, no change in status, stable for surgery.  I have reviewed the patient's chart and labs.  Questions were answered to the patient's satisfaction.     Ole ONEIDA Schick  Ok to proceed with colonoscopy

## 2023-03-25 NOTE — Transfer of Care (Signed)
 Immediate Anesthesia Transfer of Care Note  Patient: Courtney Ball  Procedure(s) Performed: COLONOSCOPY WITH PROPOFOL   Patient Location: PACU  Anesthesia Type:General  Level of Consciousness: awake, alert , and oriented  Airway & Oxygen Therapy: Patient Spontanous Breathing  Post-op Assessment: Report given to RN and Post -op Vital signs reviewed and stable  Post vital signs: Reviewed and stable  Last Vitals:  Vitals Value Taken Time  BP 92/68 03/25/23 0855  Temp 35.9 C 03/25/23 0855  Pulse 69 03/25/23 0857  Resp 16 03/25/23 0857  SpO2 100 % 03/25/23 0857  Vitals shown include unfiled device data.  Last Pain:  Vitals:   03/25/23 0855  TempSrc: Tympanic  PainSc: Asleep         Complications: No notable events documented.

## 2023-03-25 NOTE — Op Note (Signed)
 [facility] Gastroenterology Patient Name: Courtney Ball Procedure Date: 03/25/2023 8:21 AM MRN: [id number] Account #: [id number] Date of Birth: 12-04-1960 Admit Type: Outpatient Age: 63 Room: [facility] ENDO ROOM 3 Gender: Female Note Status: Finalized Instrument Name: [id number] Procedure:             Colonoscopy Indications:           High risk colon cancer surveillance: Personal history                         of colon cancer Providers:             Ole Schick MD, MD Referring MD:          No Local Md, MD (Referring MD) Medicines:             Monitored Anesthesia Care Complications:         No immediate complications. Estimated blood loss:                         Minimal. Procedure:             Pre-Anesthesia Assessment:                        - Prior to the procedure, a History and Physical was                         performed, and patient medications and allergies were                         reviewed. The patient is competent. The risks and                         benefits of the procedure and the sedation options and                         risks were discussed with the patient. All questions                         were answered and informed consent was obtained.                         Patient identification and proposed procedure were                         verified by the physician, the nurse, the                         anesthesiologist, the anesthetist and the technician                         in the endoscopy suite. Mental Status Examination:                         alert and oriented. Airway Examination: normal                         oropharyngeal airway and neck mobility. Respiratory  Examination: clear to auscultation. CV Examination:                         normal. Prophylactic Antibiotics: The patient does not                         require prophylactic antibiotics. Prior                          Anticoagulants: The patient has taken no anticoagulant                         or antiplatelet agents. ASA Grade Assessment: II - A                         patient with mild systemic disease. After reviewing                         the risks and benefits, the patient was deemed in                         satisfactory condition to undergo the procedure. The                         anesthesia plan was to use monitored anesthesia care                         (MAC). Immediately prior to administration of                         medications, the patient was re-assessed for adequacy                         to receive sedatives. The heart rate, respiratory                         rate, oxygen saturations, blood pressure, adequacy of                         pulmonary ventilation, and response to care were                         monitored throughout the procedure. The physical                         status of the patient was re-assessed after the                         procedure.                        After obtaining informed consent, the colonoscope was                         passed under direct vision. Throughout the procedure,                         the patient's blood pressure, pulse, and oxygen  saturations were monitored continuously. The                         Colonoscope was introduced through the anus and                         advanced to the the terminal ileum, with                         identification of the appendiceal orifice and IC                         valve. The colonoscopy was performed without                         difficulty. The patient tolerated the procedure well.                         The quality of the bowel preparation was good. The                         terminal ileum, ileocecal valve, appendiceal orifice,                         and rectum were photographed. Findings:      The perianal and digital rectal examinations were  normal.      The terminal ileum appeared normal.      A 3 mm polyp was found in the ascending colon. The polyp was sessile.       The polyp was removed with a cold snare. Resection and retrieval were       complete. Estimated blood loss was minimal.      A 10 mm polyp was found in the transverse colon. The polyp was       pedunculated. The polyp was removed with a hot snare. Resection and       retrieval were complete. To prevent bleeding after the polypectomy, one       hemostatic clip was successfully placed. There was no bleeding during,       or at the end, of the procedure.      A 2 mm polyp was found in the splenic flexure. The polyp was sessile.       The polyp was removed with a cold snare. Resection and retrieval were       complete. Estimated blood loss was minimal.      A 2 mm polyp was found in the recto-sigmoid colon. The polyp was       sessile. The polyp was removed with a cold snare. Resection and       retrieval were complete. Estimated blood loss was minimal.      There was evidence of a prior end-to-end colo-colonic anastomosis in the       recto-sigmoid colon. This was patent.      Internal hemorrhoids were found during retroflexion. The hemorrhoids       were Grade II (internal hemorrhoids that prolapse but reduce       spontaneously).      The exam was otherwise without abnormality on direct and retroflexion       views. Impression:            - The  examined portion of the ileum was normal.                        - One 3 mm polyp in the ascending colon, removed with                         a cold snare. Resected and retrieved.                        - One 10 mm polyp in the transverse colon, removed                         with a hot snare. Resected and retrieved. Clip was                         placed.                        - One 2 mm polyp at the splenic flexure, removed with                         a cold snare. Resected and retrieved.                         - One 2 mm polyp at the recto-sigmoid colon, removed                         with a cold snare. Resected and retrieved.                        - Internal hemorrhoids.                        - The examination was otherwise normal on direct and                         retroflexion views. Recommendation:        - Discharge patient to home.                        - Resume previous diet.                        - Continue present medications.                        - Await pathology results.                        - Repeat colonoscopy in 1 year for surveillance.                        - Return to referring physician as previously                         scheduled. Procedure Code(s):     --- Professional ---                        [phone], Colonoscopy, flexible; with removal of  tumor(s), polyp(s), or other lesion(s) by snare                         technique Diagnosis Code(s):     --- Professional ---                        S14.961, Personal history of other malignant neoplasm                         of large intestine                        K64.0, First degree hemorrhoids                        D12.2, Benign neoplasm of ascending colon                        D12.3, Benign neoplasm of transverse colon (hepatic                         flexure or splenic flexure)                        D12.7, Benign neoplasm of rectosigmoid junction CPT copyright 2022 American Medical Association. All rights reserved. The codes documented in this report are preliminary and upon coder review may  be revised to meet current compliance requirements. Ole Schick MD, MD 03/25/2023 8:58:39 AM Number of Addenda: 0 Note Initiated On: 03/25/2023 8:21 AM Scope Withdrawal Time: 0 hours 20 minutes 44 seconds  Total Procedure Duration: 0 hours 23 minutes 14 seconds  Estimated Blood Loss:  Estimated blood loss was minimal. Estimated blood                         loss: none.      [facility]

## 2023-03-25 NOTE — Anesthesia Preprocedure Evaluation (Signed)
 Anesthesia Evaluation  Patient identified by MRN, date of birth, ID band Patient awake    Reviewed: Allergy & Precautions, H&P , NPO status , Patient's Chart, lab work & pertinent test results, reviewed documented beta blocker date and time   Airway Mallampati: II   Neck ROM: full    Dental  (+) Poor Dentition   Pulmonary neg pulmonary ROS   Pulmonary exam normal        Cardiovascular Exercise Tolerance: Good Normal cardiovascular exam+ Valvular Problems/Murmurs  Rhythm:regular Rate:Normal     Neuro/Psych  PSYCHIATRIC DISORDERS  Depression     Neuromuscular disease    GI/Hepatic negative GI ROS, Neg liver ROS,,,  Endo/Other  negative endocrine ROS    Renal/GU negative Renal ROS  negative genitourinary   Musculoskeletal   Abdominal   Peds  Hematology negative hematology ROS (+)   Anesthesia Other Findings Past Medical History: No date: Bradycardia No date: Carpal tunnel syndrome No date: Constipation No date: Drug abuse (HCC) No date: Heart murmur No date: Valvular heart disease Past Surgical History: No date: COLONOSCOPY 07/31/2022: COLONOSCOPY WITH PROPOFOL ; N/A     Comment:  Procedure: COLONOSCOPY WITH PROPOFOL ;  Surgeon:               Maryruth Ole DASEN, MD;  Location: [facility] ENDOSCOPY;                Service: Endoscopy;  Laterality: N/A; 07/31/2022: ESOPHAGOGASTRODUODENOSCOPY (EGD) WITH PROPOFOL ; N/A     Comment:  Procedure: ESOPHAGOGASTRODUODENOSCOPY (EGD) WITH               PROPOFOL ;  Surgeon: Maryruth Ole DASEN, MD;  Location:               [facility] ENDOSCOPY;  Service: Endoscopy;  Laterality: N/A; 2006: HERNIA REPAIR     Comment:  ventral BMI    Body Mass Index: 21.49 kg/m     Reproductive/Obstetrics negative OB ROS                             Anesthesia Physical Anesthesia Plan  ASA: 2  Anesthesia Plan: General   Post-op Pain Management:    Induction:   PONV Risk  Score and Plan:   Airway Management Planned:   Additional Equipment:   Intra-op Plan:   Post-operative Plan:   Informed Consent: I have reviewed the patients History and Physical, chart, labs and discussed the procedure including the risks, benefits and alternatives for the proposed anesthesia with the patient or authorized representative who has indicated his/her understanding and acceptance.     Dental Advisory Given  Plan Discussed with: CRNA  Anesthesia Plan Comments:        Anesthesia Quick Evaluation

## 2023-03-27 NOTE — Anesthesia Postprocedure Evaluation (Signed)
 Anesthesia Post Note  Patient: Nellie Banas A Haste  Procedure(s) Performed: COLONOSCOPY WITH PROPOFOL  POLYPECTOMY HEMOSTASIS CLIP PLACEMENT HOT HEMOSTASIS (ARGON PLASMA COAGULATION/BICAP)  Patient location during evaluation: PACU Anesthesia Type: General Level of consciousness: awake and alert Pain management: pain level controlled Vital Signs Assessment: post-procedure vital signs reviewed and stable Respiratory status: spontaneous breathing, nonlabored ventilation, respiratory function stable and patient connected to nasal cannula oxygen Cardiovascular status: blood pressure returned to baseline and stable Postop Assessment: no apparent nausea or vomiting Anesthetic complications: no   No notable events documented.   Last Vitals:  Vitals:   03/25/23 0905 03/25/23 0915  BP: 124/83 124/76  Pulse: 68 64  Resp: 15 14  Temp:    SpO2: 100% 100%    Last Pain:  Vitals:   03/26/23 0806  TempSrc:   PainSc: 0-No pain                 Zula Hitch

## 2023-03-29 LAB — SURGICAL PATHOLOGY

## 2023-04-22 ENCOUNTER — Other Ambulatory Visit: Payer: 59

## 2023-04-22 ENCOUNTER — Ambulatory Visit: Payer: 59 | Admitting: Oncology

## 2023-04-30 ENCOUNTER — Inpatient Hospital Stay (HOSPITAL_BASED_OUTPATIENT_CLINIC_OR_DEPARTMENT_OTHER): Payer: 59 | Admitting: Oncology

## 2023-04-30 ENCOUNTER — Inpatient Hospital Stay: Payer: 59 | Attending: Oncology

## 2023-04-30 ENCOUNTER — Encounter: Payer: Self-pay | Admitting: Oncology

## 2023-04-30 VITALS — BP 106/69 | HR 71 | Temp 98.2°F | Resp 17 | Wt 124.0 lb

## 2023-04-30 DIAGNOSIS — Z7952 Long term (current) use of systemic steroids: Secondary | ICD-10-CM | POA: Insufficient documentation

## 2023-04-30 DIAGNOSIS — Z791 Long term (current) use of non-steroidal anti-inflammatories (NSAID): Secondary | ICD-10-CM | POA: Insufficient documentation

## 2023-04-30 DIAGNOSIS — C187 Malignant neoplasm of sigmoid colon: Secondary | ICD-10-CM | POA: Diagnosis present

## 2023-04-30 DIAGNOSIS — K5900 Constipation, unspecified: Secondary | ICD-10-CM | POA: Insufficient documentation

## 2023-04-30 DIAGNOSIS — R5383 Other fatigue: Secondary | ICD-10-CM | POA: Insufficient documentation

## 2023-04-30 DIAGNOSIS — K621 Rectal polyp: Secondary | ICD-10-CM | POA: Insufficient documentation

## 2023-04-30 DIAGNOSIS — Z08 Encounter for follow-up examination after completed treatment for malignant neoplasm: Secondary | ICD-10-CM

## 2023-04-30 DIAGNOSIS — D649 Anemia, unspecified: Secondary | ICD-10-CM | POA: Insufficient documentation

## 2023-04-30 DIAGNOSIS — R011 Cardiac murmur, unspecified: Secondary | ICD-10-CM | POA: Diagnosis not present

## 2023-04-30 DIAGNOSIS — K529 Noninfective gastroenteritis and colitis, unspecified: Secondary | ICD-10-CM | POA: Diagnosis not present

## 2023-04-30 DIAGNOSIS — Z85038 Personal history of other malignant neoplasm of large intestine: Secondary | ICD-10-CM | POA: Diagnosis not present

## 2023-04-30 LAB — CBC
HCT: 37.9 % (ref 36.0–46.0)
Hemoglobin: 12.5 g/dL (ref 12.0–15.0)
MCH: 28.3 pg (ref 26.0–34.0)
MCHC: 33 g/dL (ref 30.0–36.0)
MCV: 85.9 fL (ref 80.0–100.0)
Platelets: 234 10*3/uL (ref 150–400)
RBC: 4.41 MIL/uL (ref 3.87–5.11)
RDW: 13.6 % (ref 11.5–15.5)
WBC: 2.8 10*3/uL — ABNORMAL LOW (ref 4.0–10.5)
nRBC: 0 % (ref 0.0–0.2)

## 2023-04-30 LAB — IRON AND TIBC
Iron: 50 ug/dL (ref 28–170)
Saturation Ratios: 16 % (ref 10.4–31.8)
TIBC: 319 ug/dL (ref 250–450)
UIBC: 269 ug/dL

## 2023-04-30 LAB — FERRITIN: Ferritin: 127 ng/mL (ref 11–307)

## 2023-04-30 NOTE — Progress Notes (Signed)
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  Which showed evidence of severe colitis in the mid sigmoid colon.  This was followed by a colonoscopy which showed villous and fungating partially obstructing mass in the sigmoid colon 20 cm from the anal verge.  Mass involving two thirds of the lumen circumference.  Peds colonoscopy was not able to be passed.  4 mm polyp was also found in the rectum.  Mass was biopsied and was consistent with at least intramucosal adenocarcinoma but cannot exclude invasion into the submucosa.  Ferritin was borderline low at 35 with an iron saturation of 14%.  H&H normal at 12.3/36.8.   Patient had rectosigmoid colon resection at [facility] in July 2024.  Final pathology showed 6.2 cm tumor with negative margins.  No evidence of perineural or lymphatic invasion.  Tumor budding score low.  64 lymph nodes negative for malignancy.  pT2 N0.    Interval history-patient has been recovering from some form of URI over the last 2 weeks.  She is still reports feeling fatigued but is overall feeling better  ECOG PS- 1 Pain scale- 0   Review of systems- Review of Systems  Constitutional:  Positive for malaise/fatigue. Negative for chills, fever and weight loss.  HENT:   Negative for congestion, ear discharge and nosebleeds.   Eyes:  Negative for blurred vision.  Respiratory:  Negative for cough, hemoptysis, sputum production, shortness of breath and wheezing.   Cardiovascular:  Negative for chest pain, palpitations, orthopnea and claudication.  Gastrointestinal:  Negative for abdominal pain, blood in stool, constipation, diarrhea, heartburn, melena, nausea and vomiting.  Genitourinary:  Negative for dysuria, flank pain, frequency, hematuria and urgency.  Musculoskeletal:  Negative for back pain, joint pain and myalgias.  Skin:  Negative for rash.  Neurological:  Negative for dizziness, tingling, focal weakness, seizures, weakness and headaches.  Endo/Heme/Allergies:  Does not bruise/bleed easily.  Psychiatric/Behavioral:  Negative for depression and suicidal ideas. The patient does not have insomnia.       Allergies  Allergen Reactions   Sulfa Antibiotics Hives     Past Medical History:  Diagnosis Date   Bradycardia    Cancer (HCC)    Carpal tunnel syndrome    Constipation    Drug abuse (HCC)    Heart murmur    Valvular heart disease      Past Surgical History:  Procedure Laterality Date   COLON SURGERY     COLONOSCOPY     COLONOSCOPY WITH PROPOFOL N/A 07/31/2022   Procedure: COLONOSCOPY WITH PROPOFOL;  Surgeon: [name], MD;  Location: [facility] ENDOSCOPY;  Service: Endoscopy;  Laterality: N/A;   COLONOSCOPY WITH PROPOFOL N/A 03/25/2023   Procedure: COLONOSCOPY WITH PROPOFOL;  Surgeon: [name], MD;  Location: [facility] ENDOSCOPY;  Service: Endoscopy;  Laterality: N/A;   ESOPHAGOGASTRODUODENOSCOPY (EGD) WITH PROPOFOL N/A 07/31/2022  Procedure: ESOPHAGOGASTRODUODENOSCOPY (EGD) WITH PROPOFOL;  Surgeon: [name], MD;  Location: [facility] ENDOSCOPY;  Service: Endoscopy;  Laterality: N/A;   HEMOSTASIS CLIP PLACEMENT  03/25/2023   Procedure: HEMOSTASIS CLIP PLACEMENT;  Surgeon: [name], MD;  Location: [facility]  ENDOSCOPY;  Service: Endoscopy;;   HERNIA REPAIR  2006   ventral   HOT HEMOSTASIS  03/25/2023   Procedure: HOT HEMOSTASIS (ARGON PLASMA COAGULATION/BICAP);  Surgeon: [name], MD;  Location: [facility] ENDOSCOPY;  Service: Endoscopy;;   POLYPECTOMY  03/25/2023   Procedure: POLYPECTOMY;  Surgeon: [name], MD;  Location: [facility] ENDOSCOPY;  Service: Endoscopy;;    Social History   Socioeconomic History   Marital status: Single    Spouse name: Not on file   Number of children: Not on file   Years of education: Not on file   Highest education level: Not on file  Occupational History   Not on file  Tobacco Use   Smoking status: Never   Smokeless tobacco: Never  Vaping Use   Vaping status: Never Used  Substance and Sexual Activity   Alcohol use: Not Currently    Alcohol/week: 0.0 standard drinks of alcohol    Comment: "3 years ago"   Drug use: Yes    Types: Cocaine, Marijuana    Comment: cocaine 1 week ago from 07/31/22   Sexual activity: Yes    Partners: Male    Birth control/protection: Post-menopausal  Other Topics Concern   Not on file  Social History Narrative   Not on file   Social Drivers of Health   Financial Resource Strain: Medium Risk (10/04/2022)   Received from [facility]   Overall Financial Resource Strain (CARDIA)    Difficulty of Paying Living Expenses: Somewhat hard  Food Insecurity: No Food Insecurity (10/04/2022)   Received from [facility]   Hunger Vital Sign    Worried About Running Out of Food in the Last Year: Never true    Ran Out of Food in the Last Year: Never true  Recent Concern: Food Insecurity - Food Insecurity Present (08/03/2022)   Hunger Vital Sign    Worried About Running Out of Food in the Last Year: Never true    Ran Out of Food in the Last Year: Sometimes true  Transportation Needs: Unmet Transportation Needs (10/04/2022)   Received from [facility]   PRAPARE - Transportation    Lack of Transportation  (Medical): Yes    Lack of Transportation (Non-Medical): Yes  Physical Activity: Insufficiently Active (10/04/2022)   Received from [facility]   Exercise Vital Sign    Days of Exercise per Week: 3 days    Minutes of Exercise per Session: 30 min  Stress: No Stress Concern Present (10/04/2022)   Received from [facility] - Occupational Stress Questionnaire    Feeling of Stress : Only a little  Social Connections: Socially Isolated (10/04/2022)   Received from [facility]   Social Connection and Isolation Panel [NHANES]    Frequency of Communication with Friends and Family: More than three times a week    Frequency of Social Gatherings with Friends and Family: More than three times a week    Attends Religious Services: Never    Database administrator or Organizations: No    Attends Banker Meetings: Never    Marital Status: Never married  Intimate Partner Violence: Not At Risk (10/04/2022)   Received from  [facility]   Humiliation, Afraid, Rape, and Kick questionnaire    Fear of Current or Ex-Partner: No    Emotionally Abused: No    Physically Abused: No    Sexually Abused: No    Family History  Problem Relation Age of Onset   Breast cancer Neg Hx      Current Outpatient Medications:    acetaminophen (TYLENOL) 325 MG tablet, Take 650 mg by mouth every 6 (six) hours as needed for headache or moderate pain. (Patient not taking: Reported on 10/23/2022), Disp: , Rfl:    Dextromethorphan-Guaifenesin 60-1200 MG 12hr tablet, Take by mouth. (Patient not taking: Reported on 04/30/2023), Disp: , Rfl:    linaCLOtide (LINZESS PO), Take by mouth 2 (two) times daily. (Patient not taking: Reported on 04/30/2023), Disp: , Rfl:    naproxen (NAPROSYN) 500 MG tablet, Take 1 tablet (500 mg total) by mouth 2 (two) times daily with a meal. (Patient not taking: Reported on 04/30/2023), Disp: 20 tablet, Rfl: 0   polyethylene glycol powder  (GLYCOLAX/MIRALAX) powder, , Disp: , Rfl:    predniSONE (DELTASONE) 10 MG tablet, Take 4 tabs daily x1 week then decrease by 1/2 tab daily until gone (Patient not taking: Reported on 04/30/2023), Disp: 42 tablet, Rfl: 0  Physical exam:  Vitals:   04/30/23 1015  BP: 106/69  Pulse: 71  Resp: 17  Temp: 98.2 F (36.8 C)  TempSrc: Oral  SpO2: 99%  Weight: 124 lb (56.2 kg)   Physical Exam Cardiovascular:     Rate and Rhythm: Normal rate and regular rhythm.     Heart sounds: Normal heart sounds.  Pulmonary:     Effort: Pulmonary effort is normal.     Breath sounds: Normal breath sounds.  Abdominal:     General: Bowel sounds are normal.     Palpations: Abdomen is soft.  Skin:    General: Skin is warm and dry.  Neurological:     Mental Status: She is alert and oriented to person, place, and time.         Latest Ref Rng & Units 06/16/2022    7:49 PM  CMP  Glucose 70 - 99 mg/dL 87   BUN 6 - 20 mg/dL 20   Creatinine 3.24 - 1.00 mg/dL 4.01   Sodium 027 - 253 mmol/L 134   Potassium 3.5 - 5.1 mmol/L 3.4   Chloride 98 - 111 mmol/L 104   CO2 22 - 32 mmol/L 25   Calcium 8.9 - 10.3 mg/dL 8.9   Total Protein 6.5 - 8.1 g/dL 7.4   Total Bilirubin 0.3 - 1.2 mg/dL 0.5   Alkaline Phos 38 - 126 U/L 57   AST 15 - 41 U/L 16   ALT 0 - 44 U/L 13       Latest Ref Rng & Units 04/30/2023    9:46 AM  CBC  WBC 4.0 - 10.5 K/uL 2.8   Hemoglobin 12.0 - 15.0 g/dL 66.4   Hematocrit 40.3 - 46.0 % 37.9   Platelets 150 - 400 K/uL 234      Assessment and plan- Patient is a 63 y.o. 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 She is here for a routine follow-up visit  Anemia has improved from 6 months ago at 10.5 presently to 12.5.  Suspect viral etiology for her transient leukopenia today.  Iron studies are presently within normal limits.  She had a  colonoscopy in January 2025 and a few polyps were taken out which was  consistent with tubular adenoma but negative for high-grade dysplasia or malignancy.  Further colonoscopy as per GI discretion.  I will see her back in 1 year with CBC with differential CMP and CEA.  Clinically she is doing well with no signs and symptoms of recurrence based on today's exam.  No indication for surveillance imaging for stage I colon cancer   Visit Diagnosis 1. Encounter for follow-up surveillance of colon cancer   2. Normocytic anemia      Dr. Owens Shark, MD, MPH [facility] [phone] 04/30/2023 1:44 PM

## 2023-04-30 NOTE — Progress Notes (Signed)
Patient here for oncology follow-up appointment, expresses no new complaints or concerns at this time.   

## 2023-05-30 ENCOUNTER — Telehealth: Payer: Self-pay

## 2023-05-30 NOTE — Telephone Encounter (Signed)
 Spoken to patient and inform her that she will need to call [facility]. Her last colonoscopy with Dr Mia Creek on 03/25/2023. Gave patient number to [facility].

## 2023-05-30 NOTE — Telephone Encounter (Signed)
 Pt requesting call back to schedule colonoscopy.

## 2023-10-02 ENCOUNTER — Other Ambulatory Visit: Payer: Self-pay | Admitting: Family Medicine

## 2023-10-02 DIAGNOSIS — Z1231 Encounter for screening mammogram for malignant neoplasm of breast: Secondary | ICD-10-CM

## 2023-12-10 ENCOUNTER — Other Ambulatory Visit: Payer: Self-pay

## 2023-12-10 ENCOUNTER — Encounter: Payer: Self-pay | Admitting: Emergency Medicine

## 2023-12-10 ENCOUNTER — Emergency Department: Admission: EM | Admit: 2023-12-10 | Discharge: 2023-12-10 | Disposition: A | Discharge status: Home / Self Care

## 2023-12-10 DIAGNOSIS — M436 Torticollis: Secondary | ICD-10-CM | POA: Insufficient documentation

## 2023-12-10 DIAGNOSIS — M546 Pain in thoracic spine: Secondary | ICD-10-CM | POA: Diagnosis not present

## 2023-12-10 DIAGNOSIS — M542 Cervicalgia: Secondary | ICD-10-CM | POA: Diagnosis present

## 2023-12-10 MED ORDER — DEXAMETHASONE SODIUM PHOSPHATE 10 MG/ML IJ SOLN
10.0000 mg | Freq: Once | INTRAMUSCULAR | Status: AC
  Administered 2023-12-10: 10 mg via INTRAVENOUS
  Filled 2023-12-10: qty 1

## 2023-12-10 MED ORDER — MORPHINE SULFATE (PF) 4 MG/ML IV SOLN
4.0000 mg | Freq: Once | INTRAVENOUS | Status: AC
  Administered 2023-12-10: 4 mg via INTRAVENOUS
  Filled 2023-12-10: qty 1

## 2023-12-10 MED ORDER — PREDNISONE 10 MG (21) PO TBPK: ORAL_TABLET | ORAL | 0 refills | Status: AC

## 2023-12-10 MED ORDER — CYCLOBENZAPRINE HCL 10 MG PO TABS: 10.0000 mg | ORAL_TABLET | Freq: Three times a day (TID) | ORAL | 0 refills | Status: AC | PRN

## 2023-12-10 MED ORDER — CYCLOBENZAPRINE HCL 10 MG PO TABS
10.0000 mg | ORAL_TABLET | Freq: Once | ORAL | Status: AC
  Administered 2023-12-10: 10 mg via ORAL
  Filled 2023-12-10: qty 1

## 2023-12-10 MED ORDER — ONDANSETRON HCL 4 MG/2ML IJ SOLN
4.0000 mg | Freq: Once | INTRAMUSCULAR | Status: AC
  Administered 2023-12-10: 4 mg via INTRAVENOUS
  Filled 2023-12-10: qty 2

## 2023-12-10 NOTE — ED Provider Notes (Signed)
 [facility] Provider Note    Event Date/Time   First MD Initiated Contact with Patient 12/10/23 1112     (approximate)   History   Neck Pain   HPI  Courtney Ball is a 63 y.o. female history of cocaine abuse, heart murmur presents emergency department complaint of neck and upper back pain and stiffness.  Woke up this morning with increased pain.  States she had run out of muscle relaxers.  Patient denies any IV drug use.  States still uses cocaine.  Denies fever, chills.  States it does not feel like chest pain just feels like more muscle soreness      Physical Exam   Triage Vital Signs: ED Triage Vitals  Encounter Vitals Group     BP 12/10/23 1052 (!) 146/94     Girls Systolic BP Percentile --      Girls Diastolic BP Percentile --      Boys Systolic BP Percentile --      Boys Diastolic BP Percentile --      Pulse Rate 12/10/23 1052 97     Resp 12/10/23 1052 16     Temp 12/10/23 1052 97.8 F (36.6 C)     Temp Source 12/10/23 1052 Oral     SpO2 12/10/23 1052 100 %     Weight 12/10/23 1051 123 lb 14.4 oz (56.2 kg)     Height --      Head Circumference --      Peak Flow --      Pain Score 12/10/23 1050 10     Pain Loc --      Pain Education --      Exclude from Growth Chart --     Most recent vital signs: Vitals:   12/10/23 1052  BP: (!) 146/94  Pulse: 97  Resp: 16  Temp: 97.8 F (36.6 C)  SpO2: 100%     General: Awake, no distress.   CV:  Good peripheral perfusion. regular rate and  rhythm Resp:  Normal effort. Lungs CTA Abd:  No distention.   Other:  C-spine, shoulders, anterior chest muscles are all tender to palpation, grips equal bilaterally, decreased range of motion secondary to discomfort of the neck   ED Results / Procedures / Treatments   Labs (all labs ordered are listed, but only abnormal results are displayed) Labs Reviewed - No data to  display   EKG     RADIOLOGY     PROCEDURES:   Procedures  Critical Care:  no Chief Complaint  Patient presents with   Neck Pain      MEDICATIONS ORDERED IN ED: Medications  morphine (PF) 4 MG/ML injection 4 mg (4 mg Intravenous Given 12/10/23 1157)  ondansetron (ZOFRAN) injection 4 mg (4 mg Intravenous Given 12/10/23 1157)  dexamethasone (DECADRON) injection 10 mg (10 mg Intravenous Given 12/10/23 1156)  cyclobenzaprine (FLEXERIL) tablet 10 mg (10 mg Oral Given 12/10/23 1157)     IMPRESSION / MDM / ASSESSMENT AND PLAN / ED COURSE  I reviewed the triage vital signs and the nursing notes.                              Differential diagnosis includes, but is not limited to, fracture, contusion, strain, spasm  Patient's presentation is most consistent with acute illness / injury with system symptoms.   Medications given: Morphine 4 mg IV, Zofran, Decadron 10 mg IV, Flexeril  10 mg p.o.  Patient does appear to be very uncomfortable, however do feel this is mostly musculoskeletal.  Will give pain medication, Flexeril for muscle relaxer, and Decadron to decrease inflammation.  Most likely will discharge with muscle relaxer and anti-inflammatory.  With no injury, no history of IV drug use to be concern for abscess I do not feel there are any red flags to warrant imaging today.   Patient had some relief with the medications given, states still some spasms on the right side, however the patient was using heat instead of ice and feels this may have caused more inflammation.  Do not see any red flags even after the medications.  She can follow-up with her regular doctor.  She was placed on a steroid pack and Flexeril.  Return emergency department for worsening.  Discharged stable condition.  Encouraged follow-up with [facility] for continued help with her cocaine abuse.  She is in agreement treatment plan.  Discharged stable condition.   FINAL CLINICAL IMPRESSION(S) / ED DIAGNOSES   Final  diagnoses:  Neck pain  Torticollis     Rx / DC Orders   ED Discharge Orders          Ordered    cyclobenzaprine (FLEXERIL) 10 MG tablet  3 times daily PRN        12/10/23 1239    predniSONE  (STERAPRED UNI-PAK 21 TAB) 10 MG (21) TBPK tablet        12/10/23 1239             Note:  This document was prepared using Dragon voice recognition software and may include unintentional dictation errors.    Gasper Devere ORN, PA-C 12/10/23 1431    Clarine Ozell LABOR, MD 12/10/23 [phone]

## 2023-12-10 NOTE — ED Triage Notes (Signed)
 C/O neck and upper back pain and stiffness. STates awoke this morning with pain.

## 2024-02-10 ENCOUNTER — Ambulatory Visit
Admission: RE | Admit: 2024-02-10 | Discharge: 2024-02-10 | Disposition: A | Source: Ambulatory Visit | Attending: Family Medicine | Admitting: Family Medicine

## 2024-02-10 DIAGNOSIS — Z1231 Encounter for screening mammogram for malignant neoplasm of breast: Secondary | ICD-10-CM | POA: Insufficient documentation

## 2024-04-28 ENCOUNTER — Other Ambulatory Visit: Payer: 59

## 2024-04-28 ENCOUNTER — Ambulatory Visit: Payer: 59 | Admitting: Oncology
